# Patient Record
Sex: Female | Born: 1955 | Race: White | Hispanic: No | Marital: Single | Smoking: Never smoker
Health system: Southern US, Community
[De-identification: ages and names within clinical notes are randomized; demographics above are authoritative.]

## PROBLEM LIST (undated history)

## (undated) DIAGNOSIS — E7800 Pure hypercholesterolemia, unspecified: Secondary | ICD-10-CM

## (undated) DIAGNOSIS — F419 Anxiety disorder, unspecified: Secondary | ICD-10-CM

## (undated) HISTORY — PX: OTHER SURGICAL HISTORY: SHX169

## (undated) HISTORY — DX: Anxiety disorder, unspecified: F41.9

## (undated) HISTORY — DX: Pure hypercholesterolemia, unspecified: E78.00

## (undated) HISTORY — PX: LEG SURGERY: SHX1003

---

## 2001-02-10 ENCOUNTER — Ambulatory Visit (HOSPITAL_COMMUNITY): Admission: RE | Admit: 2001-02-10 | Discharge: 2001-02-10 | Payer: Self-pay | Admitting: Internal Medicine

## 2002-11-12 ENCOUNTER — Ambulatory Visit (HOSPITAL_BASED_OUTPATIENT_CLINIC_OR_DEPARTMENT_OTHER): Admission: RE | Admit: 2002-11-12 | Discharge: 2002-11-12 | Payer: Self-pay | Admitting: General Surgery

## 2002-11-12 ENCOUNTER — Encounter (INDEPENDENT_AMBULATORY_CARE_PROVIDER_SITE_OTHER): Payer: Self-pay | Admitting: Specialist

## 2006-01-10 ENCOUNTER — Ambulatory Visit (HOSPITAL_COMMUNITY): Admission: RE | Admit: 2006-01-10 | Discharge: 2006-01-10 | Payer: Self-pay | Admitting: Obstetrics and Gynecology

## 2006-01-10 ENCOUNTER — Encounter (INDEPENDENT_AMBULATORY_CARE_PROVIDER_SITE_OTHER): Payer: Self-pay | Admitting: Specialist

## 2007-03-23 ENCOUNTER — Emergency Department (HOSPITAL_COMMUNITY): Admission: EM | Admit: 2007-03-23 | Discharge: 2007-03-23 | Payer: Self-pay | Admitting: Emergency Medicine

## 2010-04-10 ENCOUNTER — Inpatient Hospital Stay (HOSPITAL_COMMUNITY)
Admission: EM | Admit: 2010-04-10 | Discharge: 2010-04-18 | Disposition: A | Discharge status: Other Acute Inpatient Hospital | Payer: Self-pay | Source: Home / Self Care
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
## 2010-07-18 LAB — GLUCOSE, CAPILLARY
Glucose-Capillary: 116 mg/dL — ABNORMAL HIGH (ref 70–99)
Glucose-Capillary: 125 mg/dL — ABNORMAL HIGH (ref 70–99)
Glucose-Capillary: 131 mg/dL — ABNORMAL HIGH (ref 70–99)
Glucose-Capillary: 131 mg/dL — ABNORMAL HIGH (ref 70–99)
Glucose-Capillary: 131 mg/dL — ABNORMAL HIGH (ref 70–99)
Glucose-Capillary: 138 mg/dL — ABNORMAL HIGH (ref 70–99)
Glucose-Capillary: 146 mg/dL — ABNORMAL HIGH (ref 70–99)
Glucose-Capillary: 99 mg/dL (ref 70–99)

## 2010-07-18 LAB — PREPARE FRESH FROZEN PLASMA
Unit division: 0
Unit division: 0
Unit division: 0
Unit division: 0
Unit division: 0
Unit division: 0
Unit division: 0
Unit division: 0

## 2010-07-18 LAB — CBC
HCT: 20 % — ABNORMAL LOW (ref 36.0–46.0)
HCT: 24.2 % — ABNORMAL LOW (ref 36.0–46.0)
HCT: 24.7 % — ABNORMAL LOW (ref 36.0–46.0)
HCT: 25.9 % — ABNORMAL LOW (ref 36.0–46.0)
HCT: 26 % — ABNORMAL LOW (ref 36.0–46.0)
HCT: 26.1 % — ABNORMAL LOW (ref 36.0–46.0)
Hemoglobin: 7.2 g/dL — ABNORMAL LOW (ref 12.0–15.0)
Hemoglobin: 7.6 g/dL — ABNORMAL LOW (ref 12.0–15.0)
Hemoglobin: 8.3 g/dL — ABNORMAL LOW (ref 12.0–15.0)
Hemoglobin: 8.9 g/dL — ABNORMAL LOW (ref 12.0–15.0)
Hemoglobin: 9.3 g/dL — ABNORMAL LOW (ref 12.0–15.0)
MCH: 29.7 pg (ref 26.0–34.0)
MCH: 29.9 pg (ref 26.0–34.0)
MCH: 30.4 pg (ref 26.0–34.0)
MCH: 30.5 pg (ref 26.0–34.0)
MCH: 30.5 pg (ref 26.0–34.0)
MCHC: 33.3 g/dL (ref 30.0–36.0)
MCHC: 33.8 g/dL (ref 30.0–36.0)
MCHC: 34.2 g/dL (ref 30.0–36.0)
MCHC: 34.7 g/dL (ref 30.0–36.0)
MCHC: 35.3 g/dL (ref 30.0–36.0)
MCHC: 35.9 g/dL (ref 30.0–36.0)
MCHC: 36 g/dL (ref 30.0–36.0)
MCV: 84.2 fL (ref 78.0–100.0)
MCV: 85.4 fL (ref 78.0–100.0)
MCV: 85.5 fL (ref 78.0–100.0)
MCV: 85.5 fL (ref 78.0–100.0)
MCV: 85.8 fL (ref 78.0–100.0)
MCV: 86.3 fL (ref 78.0–100.0)
MCV: 86.8 fL (ref 78.0–100.0)
MCV: 87.3 fL (ref 78.0–100.0)
Platelets: 102 10*3/uL — ABNORMAL LOW (ref 150–400)
Platelets: 499 10*3/uL — ABNORMAL HIGH (ref 150–400)
Platelets: 51 10*3/uL — ABNORMAL LOW (ref 150–400)
Platelets: 70 10*3/uL — ABNORMAL LOW (ref 150–400)
Platelets: 73 10*3/uL — ABNORMAL LOW (ref 150–400)
Platelets: 78 10*3/uL — ABNORMAL LOW (ref 150–400)
RBC: 2.33 MIL/uL — ABNORMAL LOW (ref 3.87–5.11)
RBC: 2.36 MIL/uL — ABNORMAL LOW (ref 3.87–5.11)
RBC: 2.54 MIL/uL — ABNORMAL LOW (ref 3.87–5.11)
RBC: 2.76 MIL/uL — ABNORMAL LOW (ref 3.87–5.11)
RBC: 2.79 MIL/uL — ABNORMAL LOW (ref 3.87–5.11)
RBC: 2.83 MIL/uL — ABNORMAL LOW (ref 3.87–5.11)
RDW: 14.1 % (ref 11.5–15.5)
RDW: 14.4 % (ref 11.5–15.5)
RDW: 15.1 % (ref 11.5–15.5)
RDW: 15.8 % — ABNORMAL HIGH (ref 11.5–15.5)
RDW: 17.1 % — ABNORMAL HIGH (ref 11.5–15.5)
WBC: 11.7 10*3/uL — ABNORMAL HIGH (ref 4.0–10.5)
WBC: 15.1 10*3/uL — ABNORMAL HIGH (ref 4.0–10.5)
WBC: 9.3 10*3/uL (ref 4.0–10.5)
WBC: 9.6 10*3/uL (ref 4.0–10.5)
WBC: 9.7 10*3/uL (ref 4.0–10.5)

## 2010-07-18 LAB — TYPE AND SCREEN
ABO/RH(D): A POS
Unit division: 0
Unit division: 0
Unit division: 0
Unit division: 0
Unit division: 0
Unit division: 0
Unit division: 0
Unit division: 0
Unit division: 0
Unit division: 0

## 2010-07-18 LAB — BLOOD GAS, ARTERIAL
Acid-Base Excess: 0.5 mmol/L (ref 0.0–2.0)
Acid-base deficit: 2.3 mmol/L — ABNORMAL HIGH (ref 0.0–2.0)
Acid-base deficit: 2.7 mmol/L — ABNORMAL HIGH (ref 0.0–2.0)
Bicarbonate: 21.7 mEq/L (ref 20.0–24.0)
Bicarbonate: 24.6 mEq/L — ABNORMAL HIGH (ref 20.0–24.0)
Bicarbonate: 24.7 mEq/L — ABNORMAL HIGH (ref 20.0–24.0)
Bicarbonate: 27.2 mEq/L — ABNORMAL HIGH (ref 20.0–24.0)
Drawn by: 30806
FIO2: 0.4 %
FIO2: 0.4 %
FIO2: 0.4 %
FIO2: 0.4 %
MECHVT: 550 mL
MECHVT: 550 mL
O2 Saturation: 97.9 %
O2 Saturation: 98.7 %
O2 Saturation: 98.9 %
O2 Saturation: 99.2 %
O2 Saturation: 99.2 %
O2 Saturation: 99.3 %
PEEP: 5 cmH2O
PEEP: 5 cmH2O
PEEP: 5 cmH2O
Patient temperature: 102
Patient temperature: 98.6
Patient temperature: 98.6
Patient temperature: 98.6
RATE: 12 resp/min
TCO2: 22.8 mmol/L (ref 0–100)
TCO2: 23.2 mmol/L (ref 0–100)
TCO2: 25.9 mmol/L (ref 0–100)
TCO2: 26 mmol/L (ref 0–100)
pCO2 arterial: 41.3 mmHg (ref 35.0–45.0)
pCO2 arterial: 41.8 mmHg (ref 35.0–45.0)
pH, Arterial: 7.247 — ABNORMAL LOW (ref 7.350–7.400)
pO2, Arterial: 110 mmHg — ABNORMAL HIGH (ref 80.0–100.0)
pO2, Arterial: 127 mmHg — ABNORMAL HIGH (ref 80.0–100.0)
pO2, Arterial: 140 mmHg — ABNORMAL HIGH (ref 80.0–100.0)

## 2010-07-18 LAB — ABO/RH: ABO/RH(D): A POS

## 2010-07-18 LAB — COMPREHENSIVE METABOLIC PANEL
AST: 92 U/L — ABNORMAL HIGH (ref 0–37)
Albumin: 2.2 g/dL — ABNORMAL LOW (ref 3.5–5.2)
Calcium: 6.4 mg/dL — CL (ref 8.4–10.5)
Creatinine, Ser: 0.93 mg/dL (ref 0.4–1.2)
GFR calc Af Amer: >60 mL/min (ref >60)
Total Protein: 3.9 g/dL — ABNORMAL LOW (ref 6.0–8.3)

## 2010-07-18 LAB — URINALYSIS, ROUTINE W REFLEX MICROSCOPIC
Bilirubin Urine: NEGATIVE
Ketones, ur: 15 mg/dL — AB
Nitrite: NEGATIVE
Protein, ur: NEGATIVE mg/dL
Urobilinogen, UA: 0.2 mg/dL (ref 0.0–1.0)
pH: 5 (ref 5.0–8.0)

## 2010-07-18 LAB — POCT I-STAT 3, ART BLOOD GAS (G3+)
Acid-base deficit: 6 mmol/L — ABNORMAL HIGH (ref 0.0–2.0)
Bicarbonate: 19.9 meq/L — ABNORMAL LOW (ref 20.0–24.0)
Patient temperature: 37
TCO2: 21 mmol/L (ref 0–100)
pH, Arterial: 7.315 — ABNORMAL LOW (ref 7.350–7.400)

## 2010-07-18 LAB — CULTURE, BLOOD (ROUTINE X 2): Culture: NO GROWTH

## 2010-07-18 LAB — BASIC METABOLIC PANEL
BUN: 11 mg/dL (ref 6–23)
BUN: 11 mg/dL (ref 6–23)
BUN: 13 mg/dL (ref 6–23)
BUN: 14 mg/dL (ref 6–23)
BUN: 7 mg/dL (ref 6–23)
CO2: 18 mEq/L — ABNORMAL LOW (ref 19–32)
CO2: 20 mEq/L (ref 19–32)
CO2: 20 mEq/L (ref 19–32)
CO2: 24 mEq/L (ref 19–32)
Calcium: 5.7 mg/dL — CL (ref 8.4–10.5)
Calcium: 6.4 mg/dL — CL (ref 8.4–10.5)
Calcium: 7 mg/dL — ABNORMAL LOW (ref 8.4–10.5)
Calcium: 7.1 mg/dL — ABNORMAL LOW (ref 8.4–10.5)
Chloride: 111 mEq/L (ref 96–112)
Chloride: 114 mEq/L — ABNORMAL HIGH (ref 96–112)
Chloride: 115 mEq/L — ABNORMAL HIGH (ref 96–112)
Chloride: 118 mEq/L — ABNORMAL HIGH (ref 96–112)
Creatinine, Ser: 1.01 mg/dL (ref 0.4–1.2)
Creatinine, Ser: 1.05 mg/dL (ref 0.4–1.2)
Creatinine, Ser: 1.13 mg/dL (ref 0.4–1.2)
GFR calc Af Amer: >60 mL/min (ref >60)
GFR calc non Af Amer: >60 mL/min (ref >60)
GFR calc non Af Amer: >60 mL/min (ref >60)
GFR calc non Af Amer: >60 mL/min (ref >60)
Glucose, Bld: 119 mg/dL — ABNORMAL HIGH (ref 70–99)
Glucose, Bld: 131 mg/dL — ABNORMAL HIGH (ref 70–99)
Glucose, Bld: 141 mg/dL — ABNORMAL HIGH (ref 70–99)
Glucose, Bld: 172 mg/dL — ABNORMAL HIGH (ref 70–99)
Glucose, Bld: 208 mg/dL — ABNORMAL HIGH (ref 70–99)
Potassium: 3.5 mEq/L (ref 3.5–5.1)
Potassium: 3.9 mEq/L (ref 3.5–5.1)
Potassium: 4.1 mEq/L (ref 3.5–5.1)
Sodium: 137 mEq/L (ref 135–145)
Sodium: 142 mEq/L (ref 135–145)
Sodium: 142 mEq/L (ref 135–145)

## 2010-07-18 LAB — HEMOGLOBIN A1C
Hgb A1c MFr Bld: 5.7 % — ABNORMAL HIGH (ref <5.7)
Mean Plasma Glucose: 117 mg/dL — ABNORMAL HIGH (ref <117)

## 2010-07-18 LAB — DIC (DISSEMINATED INTRAVASCULAR COAGULATION)PANEL
D-Dimer, Quant: 3.89 ug/mL-FEU — ABNORMAL HIGH (ref 0.00–0.48)
Fibrinogen: 161 mg/dL — ABNORMAL LOW (ref 204–475)
Fibrinogen: 461 mg/dL (ref 204–475)
INR: 1.73 — ABNORMAL HIGH (ref 0.00–1.49)
Platelets: 41 10*3/uL — ABNORMAL LOW (ref 150–400)
Platelets: 74 10*3/uL — ABNORMAL LOW (ref 150–400)
Smear Review: NONE SEEN
aPTT: 33 seconds (ref 24–37)
aPTT: 39 seconds — ABNORMAL HIGH (ref 24–37)

## 2010-07-18 LAB — LACTIC ACID, PLASMA: Lactic Acid, Venous: 6 mmol/L — ABNORMAL HIGH (ref 0.5–2.2)

## 2010-07-18 LAB — CATH TIP CULTURE

## 2010-07-18 LAB — POCT I-STAT, CHEM 8
Creatinine, Ser: 1 mg/dL (ref 0.4–1.2)
Hemoglobin: 13.3 g/dL (ref 12.0–15.0)
Potassium: 3.6 meq/L (ref 3.5–5.1)
Sodium: 139 meq/L (ref 135–145)

## 2010-07-18 LAB — URINE CULTURE: Culture  Setup Time: 201112052037

## 2010-07-18 LAB — PREPARE CRYOPRECIPITATE: Unit division: 0

## 2010-07-18 LAB — PLATELET COUNT: Platelets: 76 10*3/uL — ABNORMAL LOW (ref 150–400)

## 2010-07-18 LAB — HEMOGLOBIN AND HEMATOCRIT, BLOOD
HCT: 27.5 % — ABNORMAL LOW (ref 36.0–46.0)
Hemoglobin: 9.4 g/dL — ABNORMAL LOW (ref 12.0–15.0)
Hemoglobin: 9.6 g/dL — ABNORMAL LOW (ref 12.0–15.0)

## 2010-07-18 LAB — MRSA PCR SCREENING
MRSA by PCR: NEGATIVE
MRSA by PCR: NEGATIVE

## 2010-07-18 LAB — PROTIME-INR
INR: 0.8 (ref 0.00–1.49)
INR: 1.21 (ref 0.00–1.49)
INR: 1.24 (ref 0.00–1.49)
INR: 1.27 (ref 0.00–1.49)
INR: 1.33 (ref 0.00–1.49)
INR: 1.34 (ref 0.00–1.49)
INR: 1.51 — ABNORMAL HIGH (ref 0.00–1.49)
Prothrombin Time: 15.5 seconds — ABNORMAL HIGH (ref 11.6–15.2)
Prothrombin Time: 16.7 seconds — ABNORMAL HIGH (ref 11.6–15.2)
Prothrombin Time: 18.4 seconds — ABNORMAL HIGH (ref 11.6–15.2)
Prothrombin Time: 19.2 seconds — ABNORMAL HIGH (ref 11.6–15.2)

## 2010-07-18 LAB — POCT I-STAT 4, (NA,K, GLUC, HGB,HCT)
Glucose, Bld: 167 mg/dL — ABNORMAL HIGH (ref 70–99)
HCT: 17 % — ABNORMAL LOW (ref 36.0–46.0)
Hemoglobin: 5.8 g/dL — CL (ref 12.0–15.0)
Potassium: 4 meq/L (ref 3.5–5.1)
Sodium: 143 meq/L (ref 135–145)

## 2010-07-18 LAB — CALCIUM, IONIZED
Calcium, Ion: 1.04 mmol/L — ABNORMAL LOW (ref 1.12–1.32)
Calcium, Ion: 1.09 mmol/L — ABNORMAL LOW (ref 1.12–1.32)

## 2010-07-18 LAB — DIFFERENTIAL
Basophils Relative: 1 % (ref 0–1)
Eosinophils Absolute: 0.3 10*3/uL (ref 0.0–0.7)
Monocytes Absolute: 1.7 10*3/uL — ABNORMAL HIGH (ref 0.1–1.0)
Neutrophils Relative %: 70 % (ref 43–77)

## 2010-07-18 LAB — CULTURE, RESPIRATORY W GRAM STAIN

## 2010-07-18 LAB — PREPARE RBC (CROSSMATCH)

## 2010-07-18 LAB — PREPARE PLATELETS

## 2010-09-22 NOTE — Op Note (Signed)
   NAMEALFRED, Holmes                         ACCOUNT NO.:  [id number]   MEDICAL RECORD NO.:  [id number]                   PATIENT TYPE:  AMB   LOCATION:  DSC                                  FACILITY:  [facility]   PHYSICIAN:  Adolph Pollack, M.D.            DATE OF BIRTH:  09-04-1955   DATE OF PROCEDURE:  11/12/2002  DATE OF DISCHARGE:                                 OPERATIVE REPORT   PREOPERATIVE DIAGNOSIS:  Left nipple mass.   POSTOPERATIVE DIAGNOSIS:  Left nipple mass.   PROCEDURE:  Excision of left nipple mass.   SURGEON:  Adolph Pollack, M.D.   ANESTHESIA:  Local (1% lidocaine with epinephrine plus 0.5% plain Marcaine).   INDICATIONS:  Claudia Holmes is a 55 year old female who has had a left nipple  mass for some time.  She has noted it has changed.  She thinks it could have  been from some irritation from a sports bra.  On examination, indeed she has  a left middle mass and now presents for excision.  The procedure and risks  were explained to her preoperatively.   TECHNIQUE:  She was seen in the minor procedure room, and my initials were  placed on the left breast.  The left nipple area was then sterilely prepped  and draped.  Local anesthetic was infiltrated just superficially around the  nipple and around the mass.  The mass was then excised sharply.  Bleeding  was controlled with silver nitrate sticks.  Once hemostasis was adequate, I  placed this in antibiotic ointment and a sterile dressing on it.   She tolerated the procedure well without any apparent complications.  She  will be discharged to home in satisfactory condition.  She was given a  prescription for Vicodin if she needs it for pain.  I told her to call in  one week to see me in the office.  I told her to keep the bandage clean and  dry for two days, then she may remove it and shower and apply antibiotic  ointment and a bandage daily following that.     Adolph Pollack, M.D.    Kari Baars  D:  11/12/2002  T:  11/13/2002  Job:  [id number]   cc:   Barry Dienes. Eloise Harman, M.D.  [address]  [place]  [place] [address]  Fax: [phone]

## 2010-09-22 NOTE — H&P (Signed)
Claudia Holmes, Claudia Holmes               ACCOUNT NO.:  [id number]   MEDICAL RECORD NO.:  [id number]          PATIENT TYPE:  AMB   LOCATION:  [facility]                           [facility]   PHYSICIAN:  Zenaida Niece, M.D.DATE OF BIRTH:  1956-01-04   DATE OF ADMISSION:  01/10/2006  DATE OF DISCHARGE:                                HISTORY & PHYSICAL   CHIEF COMPLAINT:  Abnormal uterine bleeding and probable endometrial polyp.   HISTORY OF PRESENT ILLNESS:  This is a 55 year old white female, gravida 0,  para 0, whom I saw in July of this year for the first time.  She had been on  birth control pills for 2-3 months with regular periods but also frequent  breakthrough bleeding.  She was changed to [employer] 1/35 by her primary  physician for approximately 2 months, and this did not help. She then  stopped the birth control pills for approximately a month and continued to  have spotting.  Prior to starting the birth control pills, she had regular  menses without problems.  On initial pelvic exam, she had a small  endocervical polyp which was removed, and this returned benign.  She then  followed up on August 8 for a saline infusion ultrasound.  This revealed a  normal uterus with a probable 9 mm endometrial polyp in the anterior fundus.  She had normal ovaries.  Endometrial biopsy was performed at that time and  revealed benign late proliferative, early secretory endometrium without  evidence of hyperplasia or carcinoma.  Due to her abnormal bleeding and a  possible endometrial polyp, she is being admitted for hysteroscopy with  dilation and curettage and possible resection and endometrial ablation.   PAST MEDICAL HISTORY:  Significant for:  1. Hypothyroidism.  2. Hypertension.  3. Hypercholesterolemia.   PAST SURGICAL HISTORY:  Negative.   ALLERGIES:  None.   CURRENT MEDICATIONS:  Synthroid, hydrochlorothiazide, Vytorin,  spironolactone, and baby aspirin.   GYN HISTORY:  No history of  abnormal Pap smears or sexually transmitted  diseases.   SOCIAL HISTORY:  She is single and denies alcohol, tobacco, or drug use.   FAMILY HISTORY:  No GYN or colon cancer.   REVIEW OF SYSTEMS:  Normal bowel and bladder function and no other  complaints.   PHYSICAL EXAMINATION:  VITAL SIGNS: Weight is approximately 200 pounds.  GENERAL: This is a slightly obese white female in no acute distress.  NECK: Supple without lymphadenopathy or thyromegaly.  LUNGS: Clear to auscultation.  HEART: Regular rate and rhythm without murmur.  ABDOMEN: Soft, nontender, nondistended without palpable masses.  EXTREMITIES:  No edema and are nontender.  PELVIC: External genitalia has no lesions.  On speculum exam, she has a  normal cervix, and on bimanual exam, she has a small anteverted nontender  uterus and no adnexal masses.   ASSESSMENT:  Abnormal uterine bleeding with a possible endometrial polyp.  She has a benign endometrial biopsy and possible polyp by saline infusion  ultrasound.  All options have been discussed with the patient.  She wishes  to proceed with hysteroscopy with dilatation and  curettage and removal of  this polyp followed by endometrial ablation.  All risks of surgery have been  discussed, and she understands.   PLAN:  Admit the patient on the day of surgery for hysteroscopy with  dilatation and curettage, possible resection and NovaSure endometrial  ablation.      Zenaida Niece, M.D.  Electronically Signed     TDM/MEDQ  D:  01/09/2006  T:  01/09/2006  Job:  [id number]

## 2010-09-22 NOTE — Op Note (Signed)
NAMESHAKINAH, Claudia Holmes               ACCOUNT NO.:  [id number]   MEDICAL RECORD NO.:  [id number]          PATIENT TYPE:  AMB   LOCATION:  [facility]C                           FACILITY:  [facility]   PHYSICIAN:  Zenaida Niece, M.D.DATE OF BIRTH:  [birth year]   DATE OF PROCEDURE:  01/10/2006  DATE OF DISCHARGE:                                 OPERATIVE REPORT   PREOPERATIVE AND POSTOPERATIVE DIAGNOSES:  Abnormal uterine bleeding with  possible endometrial polyp.   PROCEDURES:  Hysteroscopy with dilation and curettage and endometrial  ablation with Novasure.   SURGEON:  Zenaida Niece, M.D.   ASSISTANTS:  None.   ANESTHESIA:  General with an LMA.   SPECIMENS:  Endometrial curettings to pathology.   ESTIMATED BLOOD LOSS:  Minimal.   COMPLICATIONS:  None.   FINDINGS:  She had an essentially normal endometrial cavity with a small  polyp at approximately 4 o'clock.  This was removed with curette.  The  Novasure device used a uterine depth of 4 cm, a uterine width of 2.5 cm and  used 55 watts for 83 seconds.   PROCEDURE IN DETAIL:  The patient was taken to the operating room and placed  in the dorsosupine position.  General anesthesia was induced with an LMA and  she was placed in mobile stirrups.  Perineum and vagina were then prepped  and draped in the usual sterile fashion and bladder drained with a red  rubber catheter.  A Graves speculum was inserted into the vagina and the  anterior lip of the cervix was grasped with a single-tooth tenaculum.  Paracervical block was then performed with 16 mL of 2% plain lidocaine.  Uterus sounded to 8 cm.  Cervix was gradually dilated to a size 17 dilator  and the observer hysteroscope was inserted and excellent visualization was  achieved.  She had a fairly atrophic endometrial cavity except for a small  polyp arising from approximately 4 o'clock.  Tubal ostia were normal.  The  hysteroscope was removed and sharp curettage was performed, paying  special  attention to 4 o'clock.  A small amount of tissue was removed.  The  hysteroscope was reinserted and most of the polyp had been removed.  There  was a small amount left.  Sharp curettage was again performed and a small  amount of tissue was removed.  The cervix then measured to 4 cm with the  size 7 Hegar dilator and the cervix was dilated to a size 7 and then the  size 8 Hegar dilator.  The Novasure device was then easily inserted and  appropriately deployed.  Measurements were put into the machine and the CO2  test was passed.  Endometrial ablation was then performed as above without  complications.  The Novasure device was removed and found to be intact.  Hysteroscopy was again performed and revealed good ablation of the entire  endometrial cavity.  A small remnant of the polyp was visible and I  attempted to remove this with curettage and polyp forceps.  A small amount  of tissue  was retrieved.  The single-tooth tenaculum  was then removed and bleeding  controlled with pressure.  All instruments were then removed from the  vagina.  The patient tolerated the procedure well and was taken to the  recovery room in stable condition.  Counts were correct x2.      Zenaida Niece, M.D.  Electronically Signed     TDM/MEDQ  D:  01/10/2006  T:  01/10/2006  Job:  [id number]

## 2013-12-22 ENCOUNTER — Ambulatory Visit (INDEPENDENT_AMBULATORY_CARE_PROVIDER_SITE_OTHER): Payer: Federal, State, Local not specified - PPO | Admitting: Podiatry

## 2013-12-22 ENCOUNTER — Other Ambulatory Visit: Payer: Self-pay | Admitting: *Deleted

## 2013-12-22 ENCOUNTER — Encounter: Payer: Self-pay | Admitting: Podiatry

## 2013-12-22 ENCOUNTER — Ambulatory Visit (INDEPENDENT_AMBULATORY_CARE_PROVIDER_SITE_OTHER): Payer: Federal, State, Local not specified - PPO

## 2013-12-22 VITALS — BP 131/92 | HR 74 | Resp 16 | Ht 64.0 in | Wt 216.0 lb

## 2013-12-22 DIAGNOSIS — M21619 Bunion of unspecified foot: Secondary | ICD-10-CM

## 2013-12-22 DIAGNOSIS — M2010 Hallux valgus (acquired), unspecified foot: Secondary | ICD-10-CM
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
## 2013-12-23 ENCOUNTER — Telehealth: Payer: Self-pay | Admitting: *Deleted

## 2013-12-23 NOTE — Telephone Encounter (Signed)
I called and informed her that Dr. Milinda Pointer is out of the office. He will not return until next week.  I'll call you back with a response then.  She stated okay thanks for calling and letting me know.
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
## 2013-12-28 NOTE — Telephone Encounter (Signed)
The doctors who did her surgery and prescribed the  Compression hose should do that.  I don't feel comfortable doing it. I have only seen her once and it wasn't for the edema.

## 2013-12-29 NOTE — Telephone Encounter (Signed)
I called and informed her that Dr. Milinda Pointer said you will need to get the doctor that did your surgery and wrote the prescription for the compression hose to write the note.  She stated oh okay.

## 2014-04-25 ENCOUNTER — Emergency Department (HOSPITAL_COMMUNITY)
Admission: EM | Admit: 2014-04-25 | Discharge: 2014-04-26 | Disposition: A | Discharge status: Home / Self Care | Payer: Federal, State, Local not specified - PPO | Attending: Emergency Medicine | Admitting: Emergency Medicine

## 2014-04-25 ENCOUNTER — Encounter (HOSPITAL_COMMUNITY): Payer: Self-pay

## 2014-04-25 ENCOUNTER — Emergency Department (HOSPITAL_COMMUNITY): Payer: Federal, State, Local not specified - PPO

## 2014-04-25 DIAGNOSIS — Z79899 Other long term (current) drug therapy: Secondary | ICD-10-CM | POA: Diagnosis not present

## 2014-04-25 DIAGNOSIS — F419 Anxiety disorder, unspecified: Secondary | ICD-10-CM | POA: Insufficient documentation

## 2014-04-25 DIAGNOSIS — E780 Pure hypercholesterolemia: Secondary | ICD-10-CM | POA: Insufficient documentation

## 2014-04-25 DIAGNOSIS — R079 Chest pain, unspecified: Secondary | ICD-10-CM

## 2014-04-25 LAB — BASIC METABOLIC PANEL
Anion gap: 15 (ref 5–15)
BUN: 20 mg/dL (ref 6–23)
CALCIUM: 9.9 mg/dL (ref 8.4–10.5)
CO2: 24 mEq/L (ref 19–32)
CREATININE: 1.03 mg/dL (ref 0.50–1.10)
Chloride: 103 mEq/L (ref 96–112)
GFR calc Af Amer: 69 mL/min — ABNORMAL LOW (ref >90)
GFR calc non Af Amer: 59 mL/min — ABNORMAL LOW (ref >90)
GLUCOSE: 104 mg/dL — AB (ref 70–99)
Potassium: 4.2 mEq/L (ref 3.7–5.3)
Sodium: 142 mEq/L (ref 137–147)

## 2014-04-25 LAB — CBC
HCT: 40.6 % (ref 36.0–46.0)
HEMOGLOBIN: 13.3 g/dL (ref 12.0–15.0)
MCH: 28.1 pg (ref 26.0–34.0)
MCHC: 32.8 g/dL (ref 30.0–36.0)
MCV: 85.7 fL (ref 78.0–100.0)
Platelets: 281 10*3/uL (ref 150–400)
RBC: 4.74 MIL/uL (ref 3.87–5.11)
RDW: 13.4 % (ref 11.5–15.5)
WBC: 8.6 10*3/uL (ref 4.0–10.5)

## 2014-04-25 LAB — I-STAT TROPONIN, ED: Troponin i, poc: 0 ng/mL (ref 0.00–0.08)

## 2014-04-25 MED ORDER — NITROGLYCERIN 2 % TD OINT
0.5000 [in_us] | TOPICAL_OINTMENT | Freq: Once | TRANSDERMAL | Status: AC
  Administered 2014-04-25: 0.5 [in_us] via TOPICAL
  Filled 2014-04-25: qty 1

## 2014-04-25 NOTE — ED Notes (Signed)
PER EMS: pt from home and pt reports sharp, pressure central CP that started around 1900. Denies SOB/N/V. BP-132/90, HR-74, EKG-sinus rhythm.

## 2014-04-26 LAB — I-STAT TROPONIN, ED: Troponin i, poc: 0 ng/mL (ref 0.00–0.08)

## 2014-04-26 MED ORDER — ASPIRIN 81 MG PO CHEW: 81.0000 mg | CHEWABLE_TABLET | Freq: Every day | ORAL | Status: DC
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
## 2014-04-26 NOTE — Discharge Instructions (Signed)

## 2014-04-26 NOTE — ED Notes (Signed)
Pt A&OX4, ambulatory at d/c with steady gait, NAD 

## 2014-04-29 ENCOUNTER — Other Ambulatory Visit: Payer: Self-pay | Admitting: Cardiology

## 2014-04-29 DIAGNOSIS — R079 Chest pain, unspecified: Secondary | ICD-10-CM

## 2014-05-06 ENCOUNTER — Ambulatory Visit
Admission: RE | Admit: 2014-05-06 | Discharge: 2014-05-06 | Disposition: A | Discharge status: Home / Self Care | Payer: Federal, State, Local not specified - PPO | Source: Ambulatory Visit | Attending: Cardiology | Admitting: Cardiology

## 2014-05-06 DIAGNOSIS — R079 Chest pain, unspecified: Secondary | ICD-10-CM

## 2015-03-05 ENCOUNTER — Emergency Department (HOSPITAL_COMMUNITY)
Admission: EM | Admit: 2015-03-05 | Discharge: 2015-03-05 | Disposition: A | Discharge status: Home / Self Care | Payer: Federal, State, Local not specified - PPO | Source: Home / Self Care

## 2015-03-05 ENCOUNTER — Encounter (HOSPITAL_COMMUNITY): Payer: Self-pay | Admitting: Emergency Medicine

## 2015-03-05 DIAGNOSIS — L509 Urticaria, unspecified: Secondary | ICD-10-CM | POA: Diagnosis not present

## 2015-03-05 MED ORDER — PREDNISONE 20 MG PO TABS: ORAL_TABLET | ORAL | Status: DC

## 2015-03-05 MED ORDER — TRIAMCINOLONE ACETONIDE 0.1 % EX CREA: 1.0000 "application " | TOPICAL_CREAM | Freq: Two times a day (BID) | CUTANEOUS | Status: DC

## 2015-03-05 MED ORDER — TRIAMCINOLONE ACETONIDE 40 MG/ML IJ SUSP
40.0000 mg | Freq: Once | INTRAMUSCULAR | Status: AC
  Administered 2015-03-05: 40 mg via INTRAMUSCULAR

## 2015-03-05 MED ORDER — TRIAMCINOLONE ACETONIDE 40 MG/ML IJ SUSP
INTRAMUSCULAR | Status: AC
  Filled 2015-03-05: qty 1

## 2015-03-05 NOTE — Discharge Instructions (Signed)
Hives Cold compresses locally Triamcinolone twice daily Prednisone taper dose Benadryl as directed or for non drowsy Claritin, Allegra or Zyrtec. Hives are itchy, red, swollen areas of the skin. They can vary in size and location on your body. Hives can come and go for hours or several days (acute hives) or for several weeks (chronic hives). Hives do not spread from person to person (noncontagious). They may get worse with scratching, exercise, and emotional stress. CAUSES   Allergic reaction to food, additives, or drugs.  Infections, including the common cold.  Illness, such as vasculitis, lupus, or thyroid disease.  Exposure to sunlight, heat, or cold.  Exercise.  Stress.  Contact with chemicals. SYMPTOMS   Red or white swollen patches on the skin. The patches may change size, shape, and location quickly and repeatedly.  Itching.  Swelling of the hands, feet, and face. This may occur if hives develop deeper in the skin. DIAGNOSIS  Your caregiver can usually tell what is wrong by performing a physical exam. Skin or blood tests may also be done to determine the cause of your hives. In some cases, the cause cannot be determined. TREATMENT  Mild cases usually get better with medicines such as antihistamines. Severe cases may require an emergency epinephrine injection. If the cause of your hives is known, treatment includes avoiding that trigger.  HOME CARE INSTRUCTIONS   Avoid causes that trigger your hives.  Take antihistamines as directed by your caregiver to reduce the severity of your hives. Non-sedating or low-sedating antihistamines are usually recommended. Do not drive while taking an antihistamine.  Take any other medicines prescribed for itching as directed by your caregiver.  Wear loose-fitting clothing.  Keep all follow-up appointments as directed by your caregiver. SEEK MEDICAL CARE IF:   You have persistent or severe itching that is not relieved with  medicine.  You have painful or swollen joints. SEEK IMMEDIATE MEDICAL CARE IF:   You have a fever.  Your tongue or lips are swollen.  You have trouble breathing or swallowing.  You feel tightness in the throat or chest.  You have abdominal pain. These problems may be the first sign of a life-threatening allergic reaction. Call your local emergency services (911 in [place] MAKE SURE YOU:   Understand these instructions.  Will watch your condition.  Will get help right away if you are not doing well or get worse.   This information is not intended to replace advice given to you by your health care provider. Make sure you discuss any questions you have with your health care provider.   Document Released: 04/23/2005 Document Revised: 04/28/2013 Document Reviewed: 07/17/2011 Elsevier Interactive Patient Education [employer].

## 2015-03-05 NOTE — ED Provider Notes (Addendum)
CSN: [id number]     Arrival date & time 03/05/15  1913 History   None    Chief Complaint  Patient presents with  . Allergic Reaction  . Facial Swelling   (Consider location/radiation/quality/duration/timing/severity/associated sxs/prior Treatment) HPI Comments: 59 year old female states that around noontime today while at a ball game she started developing a rash on the left arm in the antecubital fossa. This was an itchy red rash that has continued to increase in size. Identical rash occurred to the right antecubital fossa little bit later on. Following this she noticed an itchy red rash circumscribing the waist, under the waistband of her close. It is the same type of rash is on the arms. She has a small lesion to the left side of her face near the corner of her mouth. There is localized swelling and itching. She denies problems breathing, no cough no intraoral swelling or choking or problems swallowing.   Past Medical History  Diagnosis Date  . Anxiety   . High cholesterol    Past Surgical History  Procedure Laterality Date  . Leg surgery    . Arm surgery     No family history on file. Social History  Substance Use Topics  . Smoking status: Never Smoker   . Smokeless tobacco: Never Used  . Alcohol Use: None   OB History    No data available     Review of Systems  HENT: Positive for facial swelling and postnasal drip. Negative for sore throat, trouble swallowing and voice change.   Eyes: Negative.   Respiratory: Negative for cough, choking, chest tightness, shortness of breath and wheezing.   Cardiovascular: Negative.   Musculoskeletal: Negative.   Skin: Positive for rash.  Neurological: Negative.   All other systems reviewed and are negative.   Allergies  Review of patient's allergies indicates no known allergies.  Home Medications   Prior to Admission medications   Medication Sig Start Date End Date Taking? Authorizing Provider  ALPRAZolam Duanne Moron) 1 MG tablet  Take 1 mg by mouth at bedtime as needed for anxiety.    Historical Provider, MD  aspirin 81 MG chewable tablet Chew 1 tablet (81 mg total) by mouth daily. 04/26/14   Ernestina Patches, MD  atorvastatin (LIPITOR) 20 MG tablet Take 20 mg by mouth daily.  12/11/13   Historical Provider, MD  ibuprofen (ADVIL,MOTRIN) 800 MG tablet Take 800 mg by mouth every 8 (eight) hours as needed for moderate pain.    Historical Provider, MD  Mirabegron (MYRBETRIQ PO) Take 1 tablet by mouth daily.    Historical Provider, MD  predniSONE (DELTASONE) 20 MG tablet Take 3 tabs po on first day, 2 tabs second day, 2 tabs third day, 1 tab fourth day, 1 tab 5th day. Take with food. 03/05/15   Janne Napoleon, NP  TOVIAZ 8 MG TB24 tablet Take 8 mg by mouth daily.  11/06/13   Historical Provider, MD  triamcinolone cream (KENALOG) 0.1 % Apply 1 application topically 2 (two) times daily. 03/05/15   Janne Napoleon, NP   Meds Ordered and Administered this Visit   Medications  triamcinolone acetonide (KENALOG-40) injection 40 mg (not administered)    BP 149/82 mmHg  Pulse 74  Temp(Src) 98.5 F (36.9 C) (Oral)  SpO2 99% No data found.   Physical Exam  Constitutional: She appears well-developed and well-nourished. No distress.  HENT:  Mouth/Throat: Oropharynx is clear and moist. No oropharyngeal exudate.  No intraoral swelling, erythema or other discoloration. Oropharynx with PND and  mild cobblestoning. Airway is widely patent.  Eyes: Conjunctivae and EOM are normal.  Neck: Normal range of motion. Neck supple.  No swelling or rash around the anterior or posterior neck.  Cardiovascular: Normal rate, regular rhythm and normal heart sounds.   Pulmonary/Chest: Effort normal and breath sounds normal.  Musculoskeletal: She exhibits no edema.  Lymphadenopathy:    She has no cervical adenopathy.  Neurological: She is alert. No cranial nerve deficit. She exhibits normal muscle tone.  Skin: Skin is warm and dry.  There is a well marginated  raised red area in the left antecubital fossa measuring approximately 15 cm in diameter. A smaller but similar lesion occurs on the right antecubital fossa. There is a 4-5 cm wide band of urticaria that is circumferential at the waistline.  Small area of swelling to the left face and corner of the mouth. These lesions appear typical of bee stings or insect bites however patient denies any known bites or stings.   Psychiatric: She has a normal mood and affect.  Nursing note and vitals reviewed.   ED Course  Procedures (including critical care time)  Labs Review Labs Reviewed - No data to display  Imaging Review No results found.   Visual Acuity Review  Right Eye Distance:   Left Eye Distance:   Bilateral Distance:    Right Eye Near:   Left Eye Near:    Bilateral Near:         MDM   1. Urticaria of unknown origin   kenalog 40 mg IM Cold compresses locally Triamcinolone twice daily Prednisone taper dose Benadryl as directed or for non drowsy Claritin, Allegra or Zyrtec. For swelling of the tongue or inside the mouth, wheezing, cough or trouble breathing or other sick symptoms go to the emergency department promptly or call EMS.    Janne Napoleon, NP 03/05/15 2111  Janne Napoleon, NP 04/27/15 2119

## 2015-03-05 NOTE — ED Notes (Signed)
Pt here with rash progressively spreading all over Started in left arm with swelling and cellulitis spreading to wait area, right upper arm and now facial swelling   numbness noted, denies pain Itching all over,denies eating new foods or meds

## 2015-09-16 DIAGNOSIS — E038 Other specified hypothyroidism: Secondary | ICD-10-CM | POA: Diagnosis not present

## 2015-09-16 DIAGNOSIS — E784 Other hyperlipidemia: Secondary | ICD-10-CM | POA: Diagnosis not present

## 2015-09-16 DIAGNOSIS — Z0000 Encounter for general adult medical examination without abnormal findings: Secondary | ICD-10-CM | POA: Diagnosis not present

## 2015-09-28 DIAGNOSIS — I872 Venous insufficiency (chronic) (peripheral): Secondary | ICD-10-CM | POA: Diagnosis not present

## 2015-09-28 DIAGNOSIS — Z1389 Encounter for screening for other disorder: Secondary | ICD-10-CM | POA: Diagnosis not present

## 2015-09-28 DIAGNOSIS — M79661 Pain in right lower leg: Secondary | ICD-10-CM | POA: Diagnosis not present

## 2015-09-28 DIAGNOSIS — Z0000 Encounter for general adult medical examination without abnormal findings: Secondary | ICD-10-CM | POA: Diagnosis not present

## 2015-09-28 DIAGNOSIS — L989 Disorder of the skin and subcutaneous tissue, unspecified: Secondary | ICD-10-CM | POA: Diagnosis not present

## 2015-09-28 DIAGNOSIS — N3281 Overactive bladder: Secondary | ICD-10-CM | POA: Diagnosis not present

## 2015-09-28 DIAGNOSIS — I82431 Acute embolism and thrombosis of right popliteal vein: Secondary | ICD-10-CM | POA: Diagnosis not present

## 2015-09-28 DIAGNOSIS — M7121 Synovial cyst of popliteal space [Baker], right knee: Secondary | ICD-10-CM | POA: Diagnosis not present

## 2015-10-21 DIAGNOSIS — Z1231 Encounter for screening mammogram for malignant neoplasm of breast: Secondary | ICD-10-CM | POA: Diagnosis not present

## 2015-11-17 DIAGNOSIS — L905 Scar conditions and fibrosis of skin: Secondary | ICD-10-CM | POA: Diagnosis not present

## 2015-11-17 DIAGNOSIS — Z130 Encounter for screening for diseases of the blood and blood-forming organs and certain disorders involving the immune mechanism: Secondary | ICD-10-CM | POA: Diagnosis not present

## 2015-11-17 DIAGNOSIS — Z1389 Encounter for screening for other disorder: Secondary | ICD-10-CM | POA: Diagnosis not present

## 2015-11-17 DIAGNOSIS — L308 Other specified dermatitis: Secondary | ICD-10-CM | POA: Diagnosis not present

## 2015-11-17 DIAGNOSIS — R8299 Other abnormal findings in urine: Secondary | ICD-10-CM | POA: Diagnosis not present

## 2015-11-17 DIAGNOSIS — Z6836 Body mass index (BMI) 36.0-36.9, adult: Secondary | ICD-10-CM | POA: Diagnosis not present

## 2015-11-17 DIAGNOSIS — Z01419 Encounter for gynecological examination (general) (routine) without abnormal findings: Secondary | ICD-10-CM | POA: Diagnosis not present

## 2015-11-22 DIAGNOSIS — I82401 Acute embolism and thrombosis of unspecified deep veins of right lower extremity: Secondary | ICD-10-CM | POA: Diagnosis not present

## 2015-11-22 DIAGNOSIS — Z6836 Body mass index (BMI) 36.0-36.9, adult: Secondary | ICD-10-CM | POA: Diagnosis not present

## 2015-11-22 DIAGNOSIS — M79661 Pain in right lower leg: Secondary | ICD-10-CM | POA: Diagnosis not present

## 2016-02-23 ENCOUNTER — Ambulatory Visit (INDEPENDENT_AMBULATORY_CARE_PROVIDER_SITE_OTHER): Admitting: Orthopaedic Surgery

## 2016-02-23 DIAGNOSIS — M25561 Pain in right knee: Secondary | ICD-10-CM | POA: Diagnosis not present

## 2016-03-23 DIAGNOSIS — Z6837 Body mass index (BMI) 37.0-37.9, adult: Secondary | ICD-10-CM | POA: Diagnosis not present

## 2016-03-23 DIAGNOSIS — M79661 Pain in right lower leg: Secondary | ICD-10-CM | POA: Diagnosis not present

## 2016-03-23 DIAGNOSIS — I10 Essential (primary) hypertension: Secondary | ICD-10-CM | POA: Diagnosis not present

## 2016-03-23 DIAGNOSIS — I82401 Acute embolism and thrombosis of unspecified deep veins of right lower extremity: Secondary | ICD-10-CM | POA: Diagnosis not present

## 2016-05-28 DIAGNOSIS — R350 Frequency of micturition: Secondary | ICD-10-CM | POA: Diagnosis not present

## 2016-05-28 DIAGNOSIS — N3946 Mixed incontinence: Secondary | ICD-10-CM | POA: Diagnosis not present

## 2016-09-28 DIAGNOSIS — I10 Essential (primary) hypertension: Secondary | ICD-10-CM | POA: Diagnosis not present

## 2016-09-28 DIAGNOSIS — E038 Other specified hypothyroidism: Secondary | ICD-10-CM | POA: Diagnosis not present

## 2016-09-28 DIAGNOSIS — Z0000 Encounter for general adult medical examination without abnormal findings: Secondary | ICD-10-CM | POA: Diagnosis not present

## 2016-10-05 DIAGNOSIS — Z1389 Encounter for screening for other disorder: Secondary | ICD-10-CM | POA: Diagnosis not present

## 2016-10-05 DIAGNOSIS — E784 Other hyperlipidemia: Secondary | ICD-10-CM | POA: Diagnosis not present

## 2016-10-05 DIAGNOSIS — Z0000 Encounter for general adult medical examination without abnormal findings: Secondary | ICD-10-CM | POA: Diagnosis not present

## 2016-10-05 DIAGNOSIS — N3281 Overactive bladder: Secondary | ICD-10-CM | POA: Diagnosis not present

## 2016-10-05 DIAGNOSIS — E668 Other obesity: Secondary | ICD-10-CM | POA: Diagnosis not present

## 2016-10-05 DIAGNOSIS — G4709 Other insomnia: Secondary | ICD-10-CM | POA: Diagnosis not present

## 2016-10-22 DIAGNOSIS — Z1231 Encounter for screening mammogram for malignant neoplasm of breast: Secondary | ICD-10-CM | POA: Diagnosis not present

## 2016-11-19 DIAGNOSIS — Z6838 Body mass index (BMI) 38.0-38.9, adult: Secondary | ICD-10-CM | POA: Diagnosis not present

## 2016-11-19 DIAGNOSIS — Z130 Encounter for screening for diseases of the blood and blood-forming organs and certain disorders involving the immune mechanism: Secondary | ICD-10-CM | POA: Diagnosis not present

## 2016-11-19 DIAGNOSIS — Z01419 Encounter for gynecological examination (general) (routine) without abnormal findings: Secondary | ICD-10-CM | POA: Diagnosis not present

## 2016-11-19 DIAGNOSIS — N3941 Urge incontinence: Secondary | ICD-10-CM | POA: Diagnosis not present

## 2016-11-19 DIAGNOSIS — Z1389 Encounter for screening for other disorder: Secondary | ICD-10-CM | POA: Diagnosis not present

## 2016-11-20 ENCOUNTER — Encounter (INDEPENDENT_AMBULATORY_CARE_PROVIDER_SITE_OTHER): Payer: Self-pay | Admitting: Orthopaedic Surgery

## 2016-11-20 ENCOUNTER — Ambulatory Visit (INDEPENDENT_AMBULATORY_CARE_PROVIDER_SITE_OTHER): Admitting: Orthopaedic Surgery

## 2016-11-20 ENCOUNTER — Ambulatory Visit (INDEPENDENT_AMBULATORY_CARE_PROVIDER_SITE_OTHER): Payer: Self-pay

## 2016-11-20 VITALS — BP 137/87 | HR 73 | Ht 64.0 in | Wt 223.0 lb

## 2016-11-20 DIAGNOSIS — M94261 Chondromalacia, right knee: Secondary | ICD-10-CM | POA: Diagnosis not present

## 2016-11-20 DIAGNOSIS — M25561 Pain in right knee: Secondary | ICD-10-CM

## 2016-11-21 LAB — SYNOVIAL CELL COUNT + DIFF, W/ CRYSTALS
Basophils, %: 0 %
EOSINOPHILS-SYNOVIAL: 0 % (ref 0–2)
LYMPHOCYTES-SYNOVIAL FLD: 37 % (ref 0–74)
MONOCYTE/MACROPHAGE: 32 % (ref 0–69)
Neutrophil, Synovial: 21 % (ref 0–24)
Synoviocytes, %: 10 % (ref 0–15)
WBC, Synovial: 230 cells/uL — ABNORMAL HIGH (ref <150)

## 2016-11-21 MED ORDER — BUPIVACAINE HCL 0.25 % IJ SOLN
6.0000 mL | INTRAMUSCULAR | Status: AC | PRN
  Administered 2016-11-21: 6 mL via INTRA_ARTICULAR

## 2016-11-21 MED ORDER — LIDOCAINE HCL 1 % IJ SOLN
3.0000 mL | INTRAMUSCULAR | Status: AC | PRN
Start: 2016-11-21 — End: 2016-11-21
  Administered 2016-11-21: 3 mL

## 2016-11-21 MED ORDER — METHYLPREDNISOLONE ACETATE 40 MG/ML IJ SUSP
80.0000 mg | INTRAMUSCULAR | Status: AC | PRN
  Administered 2016-11-21: 80 mg
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
## 2016-11-25 LAB — BODY FLUID CULTURE
Gram Stain: NONE SEEN
Organism ID, Bacteria: NO GROWTH

## 2016-12-04 ENCOUNTER — Encounter (INDEPENDENT_AMBULATORY_CARE_PROVIDER_SITE_OTHER): Payer: Self-pay | Admitting: Orthopaedic Surgery

## 2016-12-04 ENCOUNTER — Ambulatory Visit (INDEPENDENT_AMBULATORY_CARE_PROVIDER_SITE_OTHER): Admitting: Orthopaedic Surgery

## 2016-12-04 VITALS — BP 139/87 | HR 63 | Ht 64.0 in | Wt 230.0 lb

## 2016-12-04 DIAGNOSIS — M25561 Pain in right knee: Secondary | ICD-10-CM

## 2016-12-04 DIAGNOSIS — M94261 Chondromalacia, right knee: Secondary | ICD-10-CM
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
## 2016-12-05 ENCOUNTER — Ambulatory Visit (INDEPENDENT_AMBULATORY_CARE_PROVIDER_SITE_OTHER): Payer: Self-pay | Admitting: Orthopaedic Surgery

## 2017-02-05 ENCOUNTER — Encounter (INDEPENDENT_AMBULATORY_CARE_PROVIDER_SITE_OTHER): Payer: Self-pay | Admitting: Orthopaedic Surgery

## 2017-02-05 ENCOUNTER — Ambulatory Visit (INDEPENDENT_AMBULATORY_CARE_PROVIDER_SITE_OTHER): Admitting: Orthopaedic Surgery

## 2017-02-05 VITALS — BP 134/84 | HR 61 | Ht 64.0 in | Wt 216.0 lb

## 2017-02-05 DIAGNOSIS — T148XXA Other injury of unspecified body region, initial encounter: Secondary | ICD-10-CM

## 2017-02-05 DIAGNOSIS — M25561 Pain in right knee: Secondary | ICD-10-CM
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
## 2017-02-16 DIAGNOSIS — Z23 Encounter for immunization: Secondary | ICD-10-CM | POA: Diagnosis not present

## 2017-04-04 DIAGNOSIS — R7309 Other abnormal glucose: Secondary | ICD-10-CM | POA: Diagnosis not present

## 2017-04-04 DIAGNOSIS — E7849 Other hyperlipidemia: Secondary | ICD-10-CM | POA: Diagnosis not present

## 2017-04-04 DIAGNOSIS — I872 Venous insufficiency (chronic) (peripheral): Secondary | ICD-10-CM | POA: Diagnosis not present

## 2017-04-04 DIAGNOSIS — I10 Essential (primary) hypertension: Secondary | ICD-10-CM | POA: Diagnosis not present

## 2017-06-06 DIAGNOSIS — N3942 Incontinence without sensory awareness: Secondary | ICD-10-CM | POA: Diagnosis not present

## 2017-06-06 DIAGNOSIS — B9620 Unspecified Escherichia coli [E. coli] as the cause of diseases classified elsewhere: Secondary | ICD-10-CM | POA: Diagnosis not present

## 2017-06-06 DIAGNOSIS — N3946 Mixed incontinence: Secondary | ICD-10-CM | POA: Diagnosis not present

## 2017-06-06 DIAGNOSIS — N390 Urinary tract infection, site not specified: Secondary | ICD-10-CM | POA: Diagnosis not present

## 2017-07-05 DIAGNOSIS — I10 Essential (primary) hypertension: Secondary | ICD-10-CM | POA: Diagnosis not present

## 2017-07-05 DIAGNOSIS — R7309 Other abnormal glucose: Secondary | ICD-10-CM | POA: Diagnosis not present

## 2017-07-05 DIAGNOSIS — E7849 Other hyperlipidemia: Secondary | ICD-10-CM | POA: Diagnosis not present

## 2017-07-05 DIAGNOSIS — E038 Other specified hypothyroidism: Secondary | ICD-10-CM | POA: Diagnosis not present

## 2017-07-05 DIAGNOSIS — Z1389 Encounter for screening for other disorder: Secondary | ICD-10-CM | POA: Diagnosis not present

## 2017-10-04 DIAGNOSIS — E038 Other specified hypothyroidism: Secondary | ICD-10-CM | POA: Diagnosis not present

## 2017-10-04 DIAGNOSIS — I10 Essential (primary) hypertension: Secondary | ICD-10-CM | POA: Diagnosis not present

## 2017-10-04 DIAGNOSIS — R7309 Other abnormal glucose: Secondary | ICD-10-CM | POA: Diagnosis not present

## 2017-10-04 DIAGNOSIS — R82998 Other abnormal findings in urine: Secondary | ICD-10-CM | POA: Diagnosis not present

## 2017-10-04 DIAGNOSIS — Z0000 Encounter for general adult medical examination without abnormal findings: Secondary | ICD-10-CM | POA: Diagnosis not present

## 2017-10-11 DIAGNOSIS — R7309 Other abnormal glucose: Secondary | ICD-10-CM | POA: Diagnosis not present

## 2017-10-11 DIAGNOSIS — R0602 Shortness of breath: Secondary | ICD-10-CM | POA: Diagnosis not present

## 2017-10-11 DIAGNOSIS — I10 Essential (primary) hypertension: Secondary | ICD-10-CM | POA: Diagnosis not present

## 2017-10-11 DIAGNOSIS — Z0000 Encounter for general adult medical examination without abnormal findings: Secondary | ICD-10-CM | POA: Diagnosis not present

## 2017-10-11 DIAGNOSIS — E7849 Other hyperlipidemia: Secondary | ICD-10-CM | POA: Diagnosis not present

## 2017-11-12 DIAGNOSIS — Z1231 Encounter for screening mammogram for malignant neoplasm of breast: Secondary | ICD-10-CM | POA: Diagnosis not present

## 2017-12-06 DIAGNOSIS — Z130 Encounter for screening for diseases of the blood and blood-forming organs and certain disorders involving the immune mechanism: Secondary | ICD-10-CM | POA: Diagnosis not present

## 2017-12-06 DIAGNOSIS — Z1389 Encounter for screening for other disorder: Secondary | ICD-10-CM | POA: Diagnosis not present

## 2017-12-06 DIAGNOSIS — Z01419 Encounter for gynecological examination (general) (routine) without abnormal findings: Secondary | ICD-10-CM | POA: Diagnosis not present

## 2017-12-06 DIAGNOSIS — R82998 Other abnormal findings in urine: Secondary | ICD-10-CM | POA: Diagnosis not present

## 2017-12-06 DIAGNOSIS — Z6838 Body mass index (BMI) 38.0-38.9, adult: Secondary | ICD-10-CM | POA: Diagnosis not present

## 2017-12-09 ENCOUNTER — Emergency Department (HOSPITAL_COMMUNITY): Payer: Federal, State, Local not specified - PPO

## 2017-12-09 ENCOUNTER — Observation Stay (HOSPITAL_COMMUNITY)
Admission: EM | Admit: 2017-12-09 | Discharge: 2017-12-10 | Disposition: A | Discharge status: Home / Self Care | Payer: Federal, State, Local not specified - PPO | Attending: Oncology | Admitting: Oncology

## 2017-12-09 ENCOUNTER — Encounter (HOSPITAL_COMMUNITY): Payer: Self-pay | Admitting: Emergency Medicine

## 2017-12-09 ENCOUNTER — Other Ambulatory Visit: Payer: Self-pay

## 2017-12-09 DIAGNOSIS — Z87828 Personal history of other (healed) physical injury and trauma: Secondary | ICD-10-CM

## 2017-12-09 DIAGNOSIS — F419 Anxiety disorder, unspecified: Secondary | ICD-10-CM | POA: Insufficient documentation

## 2017-12-09 DIAGNOSIS — J810 Acute pulmonary edema: Secondary | ICD-10-CM | POA: Diagnosis not present

## 2017-12-09 DIAGNOSIS — Z872 Personal history of diseases of the skin and subcutaneous tissue: Secondary | ICD-10-CM

## 2017-12-09 DIAGNOSIS — I878 Other specified disorders of veins: Secondary | ICD-10-CM | POA: Diagnosis not present

## 2017-12-09 DIAGNOSIS — Z79899 Other long term (current) drug therapy: Secondary | ICD-10-CM | POA: Diagnosis not present

## 2017-12-09 DIAGNOSIS — R0602 Shortness of breath: Secondary | ICD-10-CM | POA: Diagnosis not present

## 2017-12-09 DIAGNOSIS — E785 Hyperlipidemia, unspecified: Secondary | ICD-10-CM | POA: Diagnosis not present

## 2017-12-09 DIAGNOSIS — Z8742 Personal history of other diseases of the female genital tract: Secondary | ICD-10-CM

## 2017-12-09 DIAGNOSIS — Z86718 Personal history of other venous thrombosis and embolism: Secondary | ICD-10-CM | POA: Diagnosis not present

## 2017-12-09 DIAGNOSIS — Z9889 Other specified postprocedural states: Secondary | ICD-10-CM | POA: Insufficient documentation

## 2017-12-09 DIAGNOSIS — I2699 Other pulmonary embolism without acute cor pulmonale: Secondary | ICD-10-CM | POA: Diagnosis not present

## 2017-12-09 DIAGNOSIS — Z8781 Personal history of (healed) traumatic fracture: Secondary | ICD-10-CM

## 2017-12-09 DIAGNOSIS — E782 Mixed hyperlipidemia: Secondary | ICD-10-CM

## 2017-12-09 DIAGNOSIS — R0789 Other chest pain: Secondary | ICD-10-CM | POA: Diagnosis not present

## 2017-12-09 DIAGNOSIS — Z8249 Family history of ischemic heart disease and other diseases of the circulatory system: Secondary | ICD-10-CM

## 2017-12-09 LAB — BASIC METABOLIC PANEL
ANION GAP: 10 (ref 5–15)
BUN: 13 mg/dL (ref 6–20)
CHLORIDE: 105 mmol/L (ref 98–111)
CO2: 25 mmol/L (ref 22–32)
CREATININE: 1 mg/dL (ref 0.44–1.00)
Calcium: 8.9 mg/dL (ref 8.9–10.3)
GFR calc non Af Amer: 60 mL/min — ABNORMAL LOW (ref >60)
Glucose, Bld: 113 mg/dL — ABNORMAL HIGH (ref 70–99)
POTASSIUM: 4 mmol/L (ref 3.5–5.1)
Sodium: 140 mmol/L (ref 135–145)

## 2017-12-09 LAB — D-DIMER, QUANTITATIVE (NOT AT ARMC): D DIMER QUANT: 7.66 ug{FEU}/mL — AB (ref 0.00–0.50)

## 2017-12-09 LAB — CBC
HEMATOCRIT: 43.1 % (ref 36.0–46.0)
HEMOGLOBIN: 13.9 g/dL (ref 12.0–15.0)
MCH: 28 pg (ref 26.0–34.0)
MCHC: 32.3 g/dL (ref 30.0–36.0)
MCV: 86.7 fL (ref 78.0–100.0)
Platelets: 290 10*3/uL (ref 150–400)
RBC: 4.97 MIL/uL (ref 3.87–5.11)
RDW: 12.9 % (ref 11.5–15.5)
WBC: 10.3 10*3/uL (ref 4.0–10.5)

## 2017-12-09 LAB — I-STAT TROPONIN, ED
TROPONIN I, POC: 0.03 ng/mL (ref 0.00–0.08)
Troponin i, poc: 0.07 ng/mL (ref 0.00–0.08)

## 2017-12-09 LAB — HEPARIN LEVEL (UNFRACTIONATED): HEPARIN UNFRACTIONATED: 0.65 [IU]/mL (ref 0.30–0.70)

## 2017-12-09 MED ORDER — IOPAMIDOL (ISOVUE-370) INJECTION 76%
100.0000 mL | Freq: Once | INTRAVENOUS | Status: AC | PRN
  Administered 2017-12-09: 100 mL via INTRAVENOUS

## 2017-12-09 MED ORDER — HEPARIN (PORCINE) IN NACL 100-0.45 UNIT/ML-% IJ SOLN
1350.0000 [IU]/h | INTRAMUSCULAR | Status: DC
  Administered 2017-12-09: 1350 [IU]/h via INTRAVENOUS
  Filled 2017-12-09: qty 250

## 2017-12-09 MED ORDER — ATORVASTATIN CALCIUM 20 MG PO TABS
20.0000 mg | ORAL_TABLET | Freq: Every day | ORAL | Status: DC
  Administered 2017-12-10: 20 mg via ORAL
  Filled 2017-12-09 (×2): qty 1

## 2017-12-09 MED ORDER — HEPARIN BOLUS VIA INFUSION
5000.0000 [IU] | Freq: Once | INTRAVENOUS | Status: AC
  Administered 2017-12-09: 5000 [IU] via INTRAVENOUS
  Filled 2017-12-09: qty 5000

## 2017-12-09 NOTE — ED Triage Notes (Signed)
Onset today 0745 after patient got to work developed shortness of breath. States had a few episodes last week and resolved. Concerned about the frequency of shortness of breath. States briefly had chest pain when trying to take a deep breath today. Since then feels better denies chest pain and sob has improved.

## 2017-12-09 NOTE — ED Notes (Signed)
Pt wanted to walk to room, did not want a wheel chair

## 2017-12-09 NOTE — ED Notes (Signed)
This rn attempted iv access without success, another rn to try

## 2017-12-09 NOTE — ED Notes (Signed)
Iv team at bedside  

## 2017-12-09 NOTE — ED Provider Notes (Signed)
[facility] EMERGENCY DEPARTMENT Provider Note   CSN: [id number] Arrival date & time: 12/09/17  0908     History   Chief Complaint Chief Complaint  Patient presents with  . Shortness of Breath    HPI Claudia Holmes is a 62 y.o. female.  Claudia Holmes is a [name] with a history of high cholesterol and previous DVT, presents to the emergency department for evaluation of shortness of breath.  She reports she had an acute episode of shortness of breath that started about 745 this morning upon arriving at work.  She reports she has had some similar but less severe episodes intermittently over the past week that have resolved spontaneously on their own, but the one that she had this morning seem to last a bit longer.  She reports she had pain when taking a deep breath and felt like she could not get enough air and when she was taking a deep breath.  She reports some brief chest discomfort associated with this.  She feels that the shortness of breath and chest pain has improved since arriving at the hospital but is concerned this is happened multiple times in the past week.  She is currently chest pain-free and breathing comfortably.  No associated cough, fevers or chills.  No abdominal pain, nausea or vomiting.  Patient has not noticed any pain or swelling in her lower extremities.  She does report a few years ago she had a DVT and was put on blood thinners for about 3 months, she is not currently on any blood thinners.  No history of recent immobilization or surgery, she is not currently on any estrogen therapy or other hormones.  Remote smoking history, not a current smoker.  No history of reactive airway disease.     Past Medical History:  Diagnosis Date  . Anxiety   . High cholesterol     There are no active problems to display for this patient.   Past Surgical History:  Procedure Laterality Date  . arm surgery    . LEG SURGERY       OB History    None      Home Medications    Prior to Admission medications   Medication Sig Start Date End Date Taking? Authorizing Provider  ALPRAZolam Duanne Moron) 1 MG tablet Take 1 mg by mouth at bedtime as needed for sleep.    Yes [provider]  atorvastatin (LIPITOR) 20 MG tablet Take 20 mg by mouth daily.  12/11/13  Yes [provider]  OVER THE COUNTER MEDICATION See admin instructions. Doterra Essential Oils for pain (topically and orally) and bladder control (topically)   Yes [provider]  aspirin 81 MG chewable tablet Chew 1 tablet (81 mg total) by mouth daily. Patient not taking: Reported on 11/20/2016 04/26/14   Ernestina Patches, MD  sulfamethoxazole-trimethoprim (BACTRIM DS,SEPTRA DS) 800-160 MG tablet Take 1 tablet by mouth 2 (two) times daily. 3 day course ordered 12/09/17    [provider]  triamcinolone cream (KENALOG) 0.1 % Apply 1 application topically 2 (two) times daily. Patient not taking: Reported on 12/04/2016 03/05/15   Janne Napoleon, NP    Family History No family history on file.  Social History Social History   Tobacco Use  . Smoking status: Never Smoker  . Smokeless tobacco: Never Used  Substance Use Topics  . Alcohol use: Never    Frequency: Never  . Drug use: Never  Allergies   Patient has no known allergies.   Review of Systems Review of Systems  Constitutional: Negative for chills and fever.  HENT: Negative for congestion, rhinorrhea and sore throat.   Eyes: Negative for visual disturbance.  Respiratory: Positive for chest tightness and shortness of breath. Negative for cough and wheezing.   Cardiovascular: Positive for chest pain. Negative for palpitations and leg swelling.  Gastrointestinal: Negative for abdominal pain, nausea and vomiting.  Genitourinary: Negative for dysuria and frequency.  Musculoskeletal: Negative for arthralgias and myalgias.  Skin: Negative for color change and rash.  Neurological: Negative  for dizziness, syncope and light-headedness.     Physical Exam Updated Vital Signs BP (!) 145/103   Pulse 86   Temp 98.7 F (37.1 C) (Oral)   Resp (!) 22   Ht 5\' 4"  (1.626 m)   Wt 98 kg (216 lb)   SpO2 95%   BMI 37.08 kg/m   Physical Exam  Constitutional: She is oriented to person, place, and time. She appears well-developed and well-nourished.  Non-toxic appearance. She does not appear ill. No distress.  HENT:  Head: Normocephalic and atraumatic.  Mouth/Throat: Oropharynx is clear and moist.  Eyes: Pupils are equal, round, and reactive to light. EOM are normal. Right eye exhibits no discharge. Left eye exhibits no discharge.  Neck: Normal range of motion. Neck supple.  Cardiovascular: Normal rate, regular rhythm, normal heart sounds and intact distal pulses.  Pulmonary/Chest: Effort normal. No respiratory distress. She exhibits no tenderness.  On room air and in no acute distress.  Respirations equal and unlabored, patient able to speak in full sentences, lungs clear to auscultation bilaterally  Abdominal: Soft. Bowel sounds are normal. She exhibits no distension and no mass. There is no tenderness. There is no guarding.  Abdomen soft, nondistended, nontender to palpation in all quadrants without guarding or peritoneal signs  Musculoskeletal: She exhibits no edema or deformity.  Bilateral lower extremities without edema, erythema or tenderness  Neurological: She is alert and oriented to person, place, and time. Coordination normal.  Speech is clear, able to follow commands CN III-XII intact Normal strength in upper and lower extremities bilaterally including dorsiflexion and plantar flexion, strong and equal grip strength Sensation normal to light and sharp touch Moves extremities without ataxia, coordination intact Normal finger to nose and rapid alternating movements No pronator drift  Skin: Skin is warm and dry. Capillary refill takes less than 2 seconds. She is not  diaphoretic.  Psychiatric: She has a normal mood and affect. Her behavior is normal.  Nursing note and vitals reviewed.    ED Treatments / Results  Labs (all labs ordered are listed, but only abnormal results are displayed) Labs Reviewed  BASIC METABOLIC PANEL - Abnormal; Notable for the following components:      Result Value   Glucose, Bld 113 (*)    GFR calc non Af Amer 60 (*)    All other components within normal limits  D-DIMER, QUANTITATIVE (NOT AT [facility]) - Abnormal; Notable for the following components:   D-Dimer, Quant 7.66 (*)    All other components within normal limits  CBC  HEPARIN LEVEL (UNFRACTIONATED)  HEPARIN LEVEL (UNFRACTIONATED)  CBC  I-STAT TROPONIN, ED  I-STAT TROPONIN, ED    EKG EKG Interpretation  Date/Time:  Monday December 09 2017 09:17:37 EDT Ventricular Rate:  84 PR Interval:  154 QRS Duration: 80 QT Interval:  392 QTC Calculation: 463 R Axis:   -23 Text Interpretation:  Normal sinus rhythm Low  voltage QRS Cannot rule out Anterior infarct , age undetermined Abnormal ECG Confirmed by Gerlene Fee [phone]) on 12/09/2017 4:54:37 PM   Radiology Dg Chest 2 View  Result Date: 12/09/2017 CLINICAL DATA:  Shortness of breath EXAM: CHEST - 2 VIEW COMPARISON:  04/25/2014 FINDINGS: The heart size and mediastinal contours are within normal limits. Both lungs are clear. The visualized skeletal structures are unremarkable. IMPRESSION: No active cardiopulmonary disease. Electronically Signed   By: Kathreen Devoid   On: 12/09/2017 09:49   Ct Angio Chest Pe W And/or Wo Contrast  Result Date: 12/09/2017 CLINICAL DATA:  Shortness of breath and diaphoresis with positive D-dimer. EXAM: CT ANGIOGRAPHY CHEST WITH CONTRAST TECHNIQUE: Multidetector CT imaging of the chest was performed using the standard protocol during bolus administration of intravenous contrast. Multiplanar CT image reconstructions and MIPs were obtained to evaluate the vascular anatomy. CONTRAST:  185mL  ISOVUE-370 IOPAMIDOL (ISOVUE-370) INJECTION 76% COMPARISON:  CT chest 04/10/2010 FINDINGS: Cardiovascular: --Pulmonary arteries: Contrast injection is sufficient to demonstrate satisfactory opacification of the pulmonary arteries to the segmental level.There are bilateral central pulmonary emboli, right worse than left. On the right, there are filling defects within the main right pulmonary artery that extend into all of the proximal lobar branches. On the left, there are smaller defects within the proximal lobar branches. There is no evidence of right heart strain by CT criteria. The RV/LV ratio is slightly less than 1. The main pulmonary artery is within normal limits for size. No pulmonary infarction. --Aorta: Satisfactory opacification of the thoracic aorta. No aortic dissection or other acute aortic syndrome. Conventional 3 vessel aortic branching pattern. The aortic course and caliber are normal. There is no aortic atherosclerosis. --Heart: Mild cardiomegaly. No pericardial effusion. Mediastinum/Nodes: No mediastinal, hilar or axillary lymphadenopathy. The visualized thyroid and thoracic esophageal course are unremarkable. Lungs/Pleura: No pulmonary nodules or masses. No pleural effusion or pneumothorax. No focal airspace consolidation. No focal pleural abnormality. Right lung base atelectasis. Upper Abdomen: Contrast bolus timing is not optimized for evaluation of the abdominal organs. Within this limitation, the visualized organs of the upper abdomen are normal. Musculoskeletal: No chest wall abnormality. No acute or significant osseous findings. Review of the MIP images confirms the above findings. IMPRESSION: 1. Bilateral central pulmonary emboli, right greater than left, without right heart strain by CT measurement of RV/LV ratio. 2. No pulmonary infarction. 3. No acute aortic syndrome. Critical Value/emergent results were called by telephone at the time of interpretation on 12/09/2017 at 4:45 pm to [facility] , who verbally acknowledged these results. Electronically Signed   By: Ulyses Jarred M.D.   On: 12/09/2017 16:54    Procedures .Critical Care Performed by: Jacqlyn Larsen, PA-C Authorized by: Jacqlyn Larsen, PA-C   Critical care provider statement:    Critical care time (minutes):  60   Critical care start time:  12/09/2017 12:00 PM   Critical care end time:  12/09/2017 1:00 PM   Critical care time was exclusive of:  Separately billable procedures and treating other patients   Critical care was necessary to treat or prevent imminent or life-threatening deterioration of the following conditions:  Circulatory failure and respiratory failure   Critical care was time spent personally by me on the following activities:  Development of treatment plan with patient or surrogate, discussions with consultants, obtaining history from patient or surrogate, ordering and performing treatments and interventions, ordering and review of laboratory studies, ordering and review of radiographic studies, pulse oximetry, review of old charts  and re-evaluation of patient's condition   (including critical care time)  Medications Ordered in ED Medications  heparin ADULT infusion 100 units/mL (25000 units/249mL sodium chloride 0.45%) (1,350 Units/hr Intravenous New Bag/Given 12/09/17 1750)  iopamidol (ISOVUE-370) 76 % injection 100 mL (100 mLs Intravenous Contrast Given 12/09/17 1621)  heparin bolus via infusion 5,000 Units (5,000 Units Intravenous Bolus from Bag 12/09/17 1751)     Initial Impression / Assessment and Plan / ED Course  I have reviewed the triage vital signs and the nursing notes.  Pertinent labs & imaging results that were available during my care of the patient were reviewed by me and considered in my medical decision making (see chart for details).  Patient presents for evaluation of shortness of breath, she had a few episodes over the last week but it acutely worsened this morning upon arrival to  work.  History of prior DVT, not currently on any anticoagulants.  No prior history of ACS.  She reports some chest tightness associated with the shortness of breath and pleuritic chest pain.  Mildly hypertensive but vitals otherwise normal and patient appears to be in no acute distress on initial evaluation.  Lungs clear and heart with regular rate and rhythm.  Abdomen benign.  Given patient's history of DVT, there is concern for PE causing patient's symptoms, will get typical cardiac work-up will as d-dimer, she is comfortable and in no acute distress at this time.  Troponin negative, EKG without concerning ischemic changes and no signs of right heart strain, no acute electrolyte derangements requiring intervention.  D-dimer is significantly elevated at 7.66 raising further concern for PE.  Will get CT angio of the chest.  4:45 PM called by radiology regarding chest CT, there is evidence of bilateral pulmonary embolisms, worse on the right than the left, but there is no obvious evidence of right heart strain on CT.  Repeat troponin continues to be negative.  Patient has maintained stable vitals, intermittently mildly hypertensive, but not hypoxic or tachypneic.  No tachycardia.  Heparin ordered per pharmacy consult.  Will consult for medical admission.  Spoke with Dr. Hetty Ely with internal medicine teaching service, they will plan to see and admit the patient.  Final Clinical Impressions(s) / ED Diagnoses   Final diagnoses:  Pulmonary embolism, bilateral [facility])    ED Discharge Orders    None       Jacqlyn Larsen, [place] 12/09/17 1811    Maudie Flakes, MD 12/09/17 2303

## 2017-12-09 NOTE — ED Notes (Signed)
Unsuccessful attempt at IV placement by this RN, IV team consulted.

## 2017-12-09 NOTE — ED Notes (Signed)
Pt returned from CT with infiltrated IV, STAT IV team consulted to start heparin

## 2017-12-09 NOTE — ED Notes (Signed)
Patient transported to CT 

## 2017-12-09 NOTE — H&P (Signed)
Date: 12/09/2017               Patient Name:  Claudia Holmes MRN: [id number]  DOB: [birth year] Age / Sex: 62 y.o., female   PCP: Leanna Battles, MD         Medical Service: Internal Medicine Teaching Service         Attending Physician: Dr. Sedonia Small, Barth Kirks, MD    First Contact: Dr. Donne Hazel Pager: [phone]  Second Contact: Dr. Hetty Ely  Pager: [phone]       After Hours (After 5p/  First Contact Pager: [phone]  weekends / holidays): Second Contact Pager: [phone]   Chief Complaint: shortness of breath and sweating   History of Present Illness: Claudia Holmes is a 62yo female with history of HLD, bilateral lower extremity skin grafts, right baker's cyst complicated by DVT who presents with 5 days of dyspnea and sweating on exertion. She also reports associated chest discomfort. 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 Over the last 5 days, her symptoms have not gotten any better or worse so she called her PCP who told her to come to the ER.   In the ED, her D-dimer was elevated and CTA showed bilateral central pulmonary emboli, right > left, without right heart strain. She was started on IV heparin   She is up to date on screening mammograms, colonoscopy, and pap smears with no history of abnormal results. Last colonoscopy was 2015 and patient reports the next one is due in 2020. Unable to see report in Epic.   She is saturating well on room air and denies any current discomfort.   She has a history of a crushing bilateral leg injury from a work accident and had bilateral skin grafts placed. The grafts took the first time and she was able to regain her function/strength and return to work.  1-2 years ago she had a DVT in her right leg. She had a Baker's cyst behind right knee and on ultrasound they found a DVT. It was classified as unprovoked and she was put on Xarelto for 3 months.   Review of systems negative for cough, fevers,  n/v/d, changes in appetite or weight loss.   Meds:  Current Meds  Medication Sig  . ALPRAZolam (XANAX) 1 MG tablet Take 1 mg by mouth at bedtime as needed for sleep.   Marland Kitchen atorvastatin (LIPITOR) 20 MG tablet Take 20 mg by mouth daily.   Marland Kitchen OVER THE COUNTER MEDICATION See admin instructions. Doterra Essential Oils for pain (topically and orally) and bladder control (topically)     Allergies: Allergies as of 12/09/2017  . (No Known Allergies)   Past Medical History:  Diagnosis Date  . Anxiety   . 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 Non smoker, denies alcohol or drug use.   Surgical History: broken right arm, bilateral lower extremity skin grafts  Review of Systems: A complete ROS was negative except as per HPI.   Physical Exam: Blood pressure 116/85, pulse 81, temperature 98.7 F (37.1 C), temperature source Oral, resp. rate 19, height 5\' 4"  (1.626 m), weight 216 lb (98 kg), SpO2 99 %. Vitals:   12/09/17 1300 12/09/17 1430 12/09/17 1748 12/09/17 1800  BP: (!) 143/78 (!) 145/103 Marland Kitchen)  154/86 116/85  Pulse: 83 86 79 81  Resp: 19 (!) 22 16 19   Temp:      TempSrc:      SpO2: 94% 95% 98% 99%  Weight:      Height:       General: Vital signs reviewed.  Patient is well-developed and well-nourished, in no acute distress and cooperative with exam.  Head: Normocephalic and atraumatic. Eyes: EOMI, conjunctivae normal, no scleral icterus.  Neck: Supple, trachea midline, normal ROM, no JVD, masses, thyromegaly, or carotid bruit present. No lymphadenopathy  Cardiovascular: RRR, S1 normal, S2 normal, no murmurs, gallops, or rubs. Pulmonary/Chest: Clear to auscultation bilaterally, no wheezes, rales, or rhonchi. Abdominal: Soft, non-tender, non-distended, BS +, no masses, organomegaly, or guarding present.  Musculoskeletal: No joint deformities,  erythema, or stiffness, ROM full and nontender. Well healed skin grafts on bilateral legs Extremities: No lower extremity edema bilaterally,  pulses symmetric and intact bilaterally. No cyanosis or clubbing. Neurological: A&O x3, Strength is normal and symmetric bilaterally, cranial nerve II-XII are grossly intact, no focal motor deficit, sensory intact to light touch bilaterally.  Skin: Warm, dry and intact. No rashes or erythema. Psychiatric: Normal mood and affect. speech and behavior is normal. Cognition and memory are normal.    EKG: personally reviewed my interpretation is normal sinus rhythm   CXR: personally reviewed my interpretation is no active cardiopulmonary disease   CTA: IMPRESSION: 1. Bilateral central pulmonary emboli, right greater than left, without right heart strain by CT measurement of RV/LV ratio. 2. No pulmonary infarction. 3. No acute aortic syndrome.  Assessment & Plan by Problem: Active Problems:   Pulmonary emboli (HCC)   Hyperlipidemia  Pulmonary Embolism, unprovoked: No recent surgeries or travel. Up to date on cancer screenings (breast, colon, cervical) although no reports/imaging are available in epic. Dad with multiple pulmonary emboli.   - Continue IV heparin, will transition to po tomorrow (either warfarin or xarelto)  - Monitor telemetry  - Difficult to order any hypercoag testing given heparin already started - Per epic, last colonoscopy 2015 but unable to see report. Patient reports 5 year interval for colonoscopy. This is not the usual interval, which would be 10 years. Unable to obtain records. Patient is well nourished which decreases likelihood of malignancy. Denies family history of cancer.  - Imaging unavailable but documentation states Mammogram 2015 negative - Report unavailable but documentation states Pap smear: 2016 ASCUS and cervical biopsy   HLD: - Continue home Lipitor 20mg  qd  Dispo: Admit patient to Inpatient with expected length  of stay greater than 2 midnights.  Signed: Isabelle Course, MD 12/09/2017, 7:27 PM  Pager: [phone]

## 2017-12-09 NOTE — Progress Notes (Signed)
ANTICOAGULATION CONSULT NOTE - Initial Consult  Pharmacy Consult for Heparin Indication: pulmonary embolus  No Known Allergies  Patient Measurements: Height: 5\' 4"  (162.6 cm) Weight: 216 lb (98 kg) IBW/kg (Calculated) : 54.7 Heparin Dosing Weight: 77.3 kg  Vital Signs: Temp: 98.7 F (37.1 C) (08/05 0922) Temp Source: Oral (08/05 0922) BP: 143/78 (08/05 1300) Pulse Rate: 83 (08/05 1300)  Labs: Recent Labs    12/09/17 0938  HGB 13.9  HCT 43.1  PLT 290  CREATININE 1.00    Estimated Creatinine Clearance: 68 mL/min (by C-G formula based on SCr of 1 mg/dL).   Medical History: Past Medical History:  Diagnosis Date  . Anxiety   . High cholesterol     Assessment: 62 YO F with PMH of high cholesterol and previous DVT a few years ago (date not reported) not on anticoagulation PTA presenting with SOB. Bilateral central PE with no right heart strain present on chest CT.   CBC stable, Scr at baseline, no bleeding reported.  Goal of Therapy:  Heparin level 0.3-0.7 units/ml Monitor platelets by anticoagulation protocol: Yes   Plan:  Give 5000 units bolus x 1 Start heparin infusion at 1350 units/hr  Monitor heparin level in 6 hours Monitor s/sx of bleeding, CBC, and heparin level daily   Juanell Fairly, PharmD PGY1 Pharmacy Resident Phone [phone] 12/09/2017 5:24 PM

## 2017-12-10 ENCOUNTER — Encounter: Payer: Self-pay | Admitting: Pharmacist

## 2017-12-10 ENCOUNTER — Ambulatory Visit (HOSPITAL_BASED_OUTPATIENT_CLINIC_OR_DEPARTMENT_OTHER): Payer: Federal, State, Local not specified - PPO

## 2017-12-10 DIAGNOSIS — Z9889 Other specified postprocedural states: Secondary | ICD-10-CM | POA: Diagnosis not present

## 2017-12-10 DIAGNOSIS — I878 Other specified disorders of veins: Secondary | ICD-10-CM | POA: Diagnosis not present

## 2017-12-10 DIAGNOSIS — E785 Hyperlipidemia, unspecified: Secondary | ICD-10-CM | POA: Diagnosis not present

## 2017-12-10 DIAGNOSIS — I2699 Other pulmonary embolism without acute cor pulmonale: Secondary | ICD-10-CM | POA: Diagnosis not present

## 2017-12-10 DIAGNOSIS — Z7901 Long term (current) use of anticoagulants: Secondary | ICD-10-CM

## 2017-12-10 LAB — CBC
HEMATOCRIT: 41.1 % (ref 36.0–46.0)
Hemoglobin: 13.5 g/dL (ref 12.0–15.0)
MCH: 28.2 pg (ref 26.0–34.0)
MCHC: 32.8 g/dL (ref 30.0–36.0)
MCV: 86 fL (ref 78.0–100.0)
Platelets: 244 10*3/uL (ref 150–400)
RBC: 4.78 MIL/uL (ref 3.87–5.11)
RDW: 12.8 % (ref 11.5–15.5)
WBC: 10.5 10*3/uL (ref 4.0–10.5)

## 2017-12-10 LAB — HIV ANTIBODY (ROUTINE TESTING W REFLEX): HIV Screen 4th Generation wRfx: NONREACTIVE

## 2017-12-10 LAB — HEPARIN LEVEL (UNFRACTIONATED): HEPARIN UNFRACTIONATED: 0.55 [IU]/mL (ref 0.30–0.70)

## 2017-12-10 MED ORDER — RIVAROXABAN 20 MG PO TABS
20.0000 mg | ORAL_TABLET | Freq: Every day | ORAL | Status: DC
Start: 2017-12-31 — End: 2017-12-10

## 2017-12-10 MED ORDER — IBUPROFEN 200 MG PO TABS
400.0000 mg | ORAL_TABLET | Freq: Once | ORAL | Status: AC
  Administered 2017-12-10: 400 mg via ORAL
  Filled 2017-12-10: qty 2

## 2017-12-10 MED ORDER — RIVAROXABAN 15 MG PO TABS: 15.0000 mg | ORAL_TABLET | Freq: Two times a day (BID) | ORAL | 0 refills | Status: DC

## 2017-12-10 MED ORDER — RIVAROXABAN 15 MG PO TABS
15.0000 mg | ORAL_TABLET | Freq: Two times a day (BID) | ORAL | Status: DC
  Administered 2017-12-10 (×2): 15 mg via ORAL
  Filled 2017-12-10 (×2): qty 1

## 2017-12-10 MED ORDER — RIVAROXABAN 20 MG PO TABS: 20.0000 mg | ORAL_TABLET | Freq: Every day | ORAL | 11 refills | Status: DC

## 2017-12-10 NOTE — Progress Notes (Signed)
Patient complaining of intermittent sharp lighting pain to the right neck, no complaints of the pain radiating or chest pain, no complaints of shortness of breath, paged [facility] for orders Neta Mends RN 12:16 AM 12-09-2017

## 2017-12-10 NOTE — Discharge Instructions (Addendum)
[name], Holmes were admitted to the hospital for a blood clot in your lungs. We treated you with intravenous heparin which served to break down the clot. It will be important for you to continue taking Xarelto for the indefinite future to prevent future blood clots. The main risk of this medicine is bleeding. Please call and schedule an appointment with your primary doctor, Dr. Philip Aspen. Dr. Beryle Beams "Dr. Darnell Level" would also like you to follow up in clinic with him so he can get some other lab tests to look for inherited clotting diseases. I have called his office and left a voicemail for them to schedule you an appointment. If you don't hear from them in a week, call [phone] and inquire about your appointment with Dr. Darnell Level.  It was a pleasure taking care of you! Below you will find more information on Xarelto.  Information on my medicine - XARELTO (rivaroxaban)  This medication education was reviewed with me or my healthcare representative as part of my discharge preparation.  WHY WAS XARELTO PRESCRIBED FOR YOU? Xarelto was prescribed to treat blood clots that may have been found in the veins of your legs (deep vein thrombosis) or in your lungs (pulmonary embolism) and to reduce the risk of them occurring again.  What do you need to know about Xarelto? The starting dose is one 15 mg tablet taken TWICE daily with food for the FIRST 21 DAYS then on (enter date)  12/31/2017  the dose is changed to one 20 mg tablet taken ONCE A DAY with your evening meal.  DO NOT stop taking Xarelto without talking to the health care provider who prescribed the medication.  Refill your prescription for 20 mg tablets before you run out.  After discharge, you should have regular check-up appointments with your healthcare provider that is prescribing your Xarelto.  In the future your dose may need to be changed if your kidney function changes by a significant amount.  What do you do if you miss a dose? If you  are taking Xarelto TWICE DAILY and you miss a dose, take it as soon as you remember. You may take two 15 mg tablets (total 30 mg) at the same time then resume your regularly scheduled 15 mg twice daily the next day.  If you are taking Xarelto ONCE DAILY and you miss a dose, take it as soon as you remember on the same day then continue your regularly scheduled once daily regimen the next day. Do not take two doses of Xarelto at the same time.   Important Safety Information Xarelto is a blood thinner medicine that can cause bleeding. You should call your healthcare provider right away if you experience any of the following: ? Bleeding from an injury or your nose that does not stop. ? Unusual colored urine (red or dark brown) or unusual colored stools (red or black). ? Unusual bruising for unknown reasons. ? A serious fall or if you hit your head (even if there is no bleeding).  Some medicines may interact with Xarelto and might increase your risk of bleeding while on Xarelto. To help avoid this, consult your healthcare provider or pharmacist prior to using any new prescription or non-prescription medications, including herbals, vitamins, non-steroidal anti-inflammatory drugs (NSAIDs) and supplements.  This website has more information on Xarelto: [email or web address]

## 2017-12-10 NOTE — Progress Notes (Signed)
[place] for Heparin > Xarelto Indication: pulmonary embolus  No Known Allergies  Patient Measurements: Height: 5\' 4"  (162.6 cm) Weight: 222 lb 4.8 oz (100.8 kg) IBW/kg (Calculated) : 54.7 Heparin Dosing Weight: 77.3 kg  Vital Signs: Temp: 97.9 F (36.6 C) (08/06 0357) Temp Source: Oral (08/06 0357) BP: 134/76 (08/06 0357) Pulse Rate: 75 (08/06 0357)  Labs: Recent Labs    12/09/17 0938 12/09/17 2254 12/10/17 0639  HGB 13.9  --   --   HCT 43.1  --   --   PLT 290  --   --   HEPARINUNFRC  --  0.65 0.55  CREATININE 1.00  --   --     Estimated Creatinine Clearance: 69 mL/min (by C-G formula based on SCr of 1 mg/dL).   Medical History: Past Medical History:  Diagnosis Date  . Anxiety   . High cholesterol     Assessment: 62 YO F with PMH of high cholesterol and previous DVT a few years ago (date not reported) not on anticoagulation PTA presenting with SOB. Bilateral central PE with no right heart strain present on chest CT.   Pharmacy consulted to transition from heparin to Xarelto. CBC wnl, Scr at baseline, no bleeding reported.  Goal of Therapy:  VTE treatment Monitor platelets by anticoagulation protocol: Yes   Plan:  D/c heparin at time of 1st dose of Xarelto - communicated plan with RN Xarelto 15mg  PO BID with meals x 21 days; then 20mg  PO Qsupper Monitor CBC, s/sx bleeding  Elicia Lamp, PharmD, BCPS Clinical Pharmacist Please check AMION for all [facility] contact numbers 12/10/2017 7:28 AM

## 2017-12-10 NOTE — Progress Notes (Signed)
Xarelto was reviewed with the patient, including name, instructions, indication, goals of therapy, potential side effects, importance of adherence, and safe use.  Patient reports she has taken Xarelto in the past and has no concerns today. Provided free trial card and $10 copay card..  Patient verbalized understanding by repeating back information and was advised to contact me if medication-related questions arise. Patient was also provided an information handout.

## 2017-12-10 NOTE — Care Management (Signed)
12-10-17   BENEFITS CHECK :  #  6.   S/W   DARNELL # F.P.P. RX #  [phone]    XARELTO   15 MG BID COVER- YES CO-PAY- $ 55.00 TIER- NO PRIOR  APPROVAL- NO  PREFERRED PHARMACY : YES  [facility]  AND [facility]

## 2017-12-10 NOTE — Discharge Summary (Signed)
Name: Claudia Holmes MRN: [id number] DOB: 02/12/1956 62 y.o. PCP: Leanna Battles, MD  Date of Admission: 12/09/2017  9:13 AM Date of Discharge:  Attending Physician: Annia Belt, MD  Discharge Diagnosis: 1. Pulmonary embolism, unprovoked  Discharge Medications: Allergies as of 12/10/2017   No Known Allergies     Medication List    STOP taking these medications   aspirin 81 MG chewable tablet   sulfamethoxazole-trimethoprim 800-160 MG tablet Commonly known as:  BACTRIM DS,SEPTRA DS   triamcinolone cream 0.1 % Commonly known as:  KENALOG     TAKE these medications   ALPRAZolam 1 MG tablet Commonly known as:  XANAX Take 1 mg by mouth at bedtime as needed for sleep.   atorvastatin 20 MG tablet Commonly known as:  LIPITOR Take 20 mg by mouth daily.   OVER THE COUNTER MEDICATION See admin instructions. Doterra Essential Oils for pain (topically and orally) and bladder control (topically)   Rivaroxaban 15 MG Tabs tablet Commonly known as:  XARELTO Take 1 tablet (15 mg total) by mouth 2 (two) times daily with a meal.   rivaroxaban 20 MG Tabs tablet Commonly known as:  XARELTO Take 1 tablet (20 mg total) by mouth daily with supper. Start taking on:  12/31/2017       Disposition and follow-up:   Ms.Claudia Holmes was discharged from [facility] in Good condition.  At the hospital follow up visit please address:  1.  Adherence to Xarelto, she will need to be on anticoagulation for an indefinite period of time since she's had 2 unprovoked blood clots (2017 DVT right leg, 2019 PE 2/2 left leg DVT)  2.  Labs / imaging needed at time of follow-up: appointment with Dr. Darnell Level for antiphospholipid antibody syndrome workup   3.  Pending labs/ test needing follow-up:   Follow-up Appointments: Follow-up Information    Leanna Battles, MD Follow up in 1 week(s).   Specialty:  Internal Medicine Why:  Please call and make an appointment for a hospital  follow up with Dr. Debarah Crape information: [place] [address] [phone]        Annia Belt, MD Follow up.   Specialty:  Oncology Why:  I left a voicemail for the clinic scheduler to make you an appointment. Hopefully, you will here from them in the next week about when your appointment will be. If you don't hear anything, you can call [phone] for an update Contact information: [place] [address] [phone]           Hospital Course by problem list: 62 year old female with h/o HLD, crushing injury to bilateral legs in 2011, and DVT in 2017 who presents with 5 days of dyspnea on exertion with diaphoresis. Hemodynamically stable and CTA with bilateral central pulmonary emboli without right heart strain.   Pulmonary Embolism, unprovoked: Remained hemodynamically stable throughout admission. Did not require oxygen. No right heart strain. No elevation of troponin or EKG changes.Treated with IV heparin and transitioned to po Xarelto. At this time, the PE appears unprovoked. No recent surgeries or travel. Her previous DVT in 2017 was treated with 3 months of Xarelto. Up to date on cancer screenings (breast, colon, cervical)although no reports/imaging are available in epic. Family history pertinent for dad with multiple pulmonary emboli. -Given that this her second blood clot she will need anticoagulation for life.  -Discharged on Xarelto 15mg  PO BID with meals x 21 days;  then 20mg  PO Qsupper - Scheduled outpatient with Dr. Beryle Beams for antiphospholipid syndrome workup.   HLD: - Continued home Lipitor 20mg  qd   Discharge Vitals:   BP 134/76 (BP Location: Left Arm)   Pulse 75   Temp 97.9 F (36.6 C) (Oral)   Resp 20   Ht 5\' 4"  (1.626 m)   Wt 222 lb 4.8 oz (100.8 kg)   SpO2 94%   BMI 38.16 kg/m   Pertinent Labs, Studies, and Procedures:  CTA Chest 1. Bilateral central pulmonary emboli, right greater than  left, without right heart strain by CT measurement of RV/LV ratio. 2. No pulmonary infarction. 3. No acute aortic syndrome.  VAS [place] Lower Extremity Venous (DVT):  Right lower extremity is negative for DVT. Left lower extremity is positive for DVT in the popliteal vein.   There is no evidence of Baker's cyst bilaterally.   Discharge Instructions:  Discharge Instructions    Call MD for:  difficulty breathing, headache or visual disturbances   Complete by:  As directed    Call MD for:  difficulty breathing, headache or visual disturbances   Complete by:  As directed    Call MD for:  severe uncontrolled pain   Complete by:  As directed    Call MD for:  severe uncontrolled pain   Complete by:  As directed    Diet - low sodium heart healthy   Complete by:  As directed    Diet - low sodium heart healthy   Complete by:  As directed    Increase activity slowly   Complete by:  As directed    Increase activity slowly   Complete by:  As directed     [name], Holmes were admitted to the hospital for a blood clot in your lungs. The ultrasound of your legs ended up showing a blood clot in your left leg. This doesn't change your treatment at all; it just gives [place] an answer as to where your lung clot came from! We treated you with intravenous heparin which served to break down the clots. It will be important for you to continue taking Xarelto for the indefinite future to prevent future blood clots. The main risk of this medicine is bleeding. Please call and schedule an appointment with your primary doctor, Dr. Philip Aspen. Dr. Beryle Beams "Dr. Darnell Level" would also like you to follow up in clinic with him so he can get some other lab tests to look for inherited clotting diseases. I have called his office and left a voicemail for them to schedule you an appointment. If you don't hear from them in a week, call [phone] and inquire about your appointment with Dr. Darnell Level.  It was a pleasure taking care of you!    Signed: Isabelle Course, MD 12/10/2017, 4:06 PM   Pager: [phone]

## 2017-12-10 NOTE — Progress Notes (Signed)
Discharge instructions have been provided to the patient and her family including new medications, follow up appointments and prescriptions. Patient and family verbalized understanding of discharge instructions.

## 2017-12-10 NOTE — Progress Notes (Signed)
Benefit check in progress for Claudia Holmes [phone]

## 2017-12-10 NOTE — Progress Notes (Signed)
   Subjective: Claudia Holmes continues to be in good spirits. Denies shortness of breath or chest pain with deep inspiration. Maintaining good oxygen saturations on room air. We discussed the unclear etiology of her PE and need for indefinite anticoagulation therapy. She is amenable to long term Xarelto and we have arranged for pharmacy to deliver free trial card and $10 copay card at time of discharge. We are getting an ultrasound of her legs to rule out DVT and then plan to discharge her this afternoon.   Objective:  Vital signs in last 24 hours: Vitals:   12/09/17 2043 12/09/17 2140 12/09/17 2316 12/10/17 0357  BP: 137/86 (!) 153/90 (!) 139/93 134/76  Pulse: 79 79 78 75  Resp: 17 (!) 24 20 20   Temp:  98.1 F (36.7 C) 98.2 F (36.8 C) 97.9 F (36.6 C)  TempSrc:  Oral Oral Oral  SpO2: 96% 97% 95% 94%  Weight:  223 lb 9.6 oz (101.4 kg)  222 lb 4.8 oz (100.8 kg)  Height:  5\' 4"  (1.626 m)     Gen: Well appearing, NAD ENT: OP clear without erythema or exudate.  Neck: No cervical LAD, No thyromegaly or nodules, No JVD. CV: RRR, no murmurs Pulm: Normal effort, CTA throughout, no wheezing Abd: Soft, NT, ND, normal BS.  Ext: Warm, no edema, normal joints Skin:  No rashes   Assessment/Plan:  Active Problems:   Pulmonary emboli (HCC)   Hyperlipidemia   Pulmonary embolism ([place])  62 year old female with h/o HLD, crushing injury to bilateral legs in 2011, and DVT in 2017 who presents with 5 days of dyspnea on exertion with diaphoresis. Hemodynamically stable and CTA with bilateral central pulmonary emboli without right heart strain.   Pulmonary Embolism, unprovoked: Remains hemodynamically stable. At this time, the PE appears unprovoked. No recent surgeries or travel. Her previous DVT was treated with 3 months of Xarelto. Up to date on cancer screenings (breast, colon, cervical) although no reports/imaging are available in epic. Dad with multiple pulmonary emboli.  - Given that this her  second blood clot she will need anticoagulation for life.  - D/c IV heparin and start Xarelto: Xarelto 15mg  PO BID with meals x 21 days; then 20mg  PO Qsupper - Schedule outpatient f/u and rec'd anticoagulation testing for antiphospholipid syndrome   HLD: - Continue home Lipitor 20mg  qd   Dispo: Anticipated discharge in approximately 1 day(s).   Isabelle Course, MD 12/10/2017, 7:20 AM Pager: [phone]

## 2017-12-10 NOTE — Progress Notes (Signed)
ANTICOAGULATION CONSULT NOTE   Pharmacy Consult for Heparin Indication: pulmonary embolus  No Known Allergies  Patient Measurements: Height: 5\' 4"  (162.6 cm) Weight: 223 lb 9.6 oz (101.4 kg) IBW/kg (Calculated) : 54.7 Heparin Dosing Weight: 77.3 kg  Vital Signs: Temp: 98.2 F (36.8 C) (08/05 2316) Temp Source: Oral (08/05 2316) BP: 139/93 (08/05 2316) Pulse Rate: 78 (08/05 2316)  Labs: Recent Labs    12/09/17 0938 12/09/17 2254  HGB 13.9  --   HCT 43.1  --   PLT 290  --   HEPARINUNFRC  --  0.65  CREATININE 1.00  --     Estimated Creatinine Clearance: 69.3 mL/min (by C-G formula based on SCr of 1 mg/dL).  Assessment: 62 y.o. female with PE for heparin  Goal of Therapy:  Heparin level 0.3-0.7 units/ml Monitor platelets by anticoagulation protocol: Yes   Plan:  Continue Heparin at current rate Follow-up am labs.  Phillis Knack, PharmD, BCPS  12/10/2017 12:14 AM

## 2017-12-10 NOTE — Progress Notes (Signed)
*  Preliminary Results*  Bilateral lower extremity venous duplex completed. Right lower extremity is negative for DVT. Left lower extremity is positive for DVT in the popliteal vein.   There is no evidence of Baker's cyst bilaterally.  12/10/2017 1:57 PM Amer Alcindor Dawna Part

## 2017-12-13 DIAGNOSIS — Z6838 Body mass index (BMI) 38.0-38.9, adult: Secondary | ICD-10-CM | POA: Diagnosis not present

## 2017-12-13 DIAGNOSIS — I10 Essential (primary) hypertension: Secondary | ICD-10-CM | POA: Diagnosis not present

## 2017-12-13 DIAGNOSIS — I2699 Other pulmonary embolism without acute cor pulmonale: Secondary | ICD-10-CM | POA: Diagnosis not present

## 2017-12-13 DIAGNOSIS — E038 Other specified hypothyroidism: Secondary | ICD-10-CM | POA: Diagnosis not present

## 2017-12-20 DIAGNOSIS — R0602 Shortness of breath: Secondary | ICD-10-CM | POA: Diagnosis not present

## 2017-12-20 DIAGNOSIS — I2699 Other pulmonary embolism without acute cor pulmonale: Secondary | ICD-10-CM | POA: Diagnosis not present

## 2017-12-20 DIAGNOSIS — R05 Cough: Secondary | ICD-10-CM | POA: Diagnosis not present

## 2017-12-20 DIAGNOSIS — J40 Bronchitis, not specified as acute or chronic: Secondary | ICD-10-CM | POA: Diagnosis not present

## 2017-12-27 DIAGNOSIS — I2699 Other pulmonary embolism without acute cor pulmonale: Secondary | ICD-10-CM | POA: Diagnosis not present

## 2017-12-27 DIAGNOSIS — E668 Other obesity: Secondary | ICD-10-CM | POA: Diagnosis not present

## 2017-12-27 DIAGNOSIS — R7309 Other abnormal glucose: Secondary | ICD-10-CM | POA: Diagnosis not present

## 2017-12-27 DIAGNOSIS — R0602 Shortness of breath: Secondary | ICD-10-CM | POA: Diagnosis not present

## 2017-12-30 ENCOUNTER — Encounter: Payer: Federal, State, Local not specified - PPO | Admitting: Oncology

## 2017-12-31 ENCOUNTER — Other Ambulatory Visit: Payer: Self-pay

## 2017-12-31 ENCOUNTER — Ambulatory Visit (INDEPENDENT_AMBULATORY_CARE_PROVIDER_SITE_OTHER): Payer: Federal, State, Local not specified - PPO | Admitting: Oncology

## 2017-12-31 ENCOUNTER — Encounter: Payer: Self-pay | Admitting: Oncology

## 2017-12-31 VITALS — BP 140/65 | HR 84 | Temp 97.8°F | Ht 63.0 in | Wt 226.9 lb

## 2017-12-31 DIAGNOSIS — I2699 Other pulmonary embolism without acute cor pulmonale: Secondary | ICD-10-CM | POA: Diagnosis not present

## 2017-12-31 DIAGNOSIS — Z823 Family history of stroke: Secondary | ICD-10-CM

## 2017-12-31 DIAGNOSIS — I82432 Acute embolism and thrombosis of left popliteal vein: Secondary | ICD-10-CM

## 2017-12-31 DIAGNOSIS — Z86718 Personal history of other venous thrombosis and embolism: Secondary | ICD-10-CM | POA: Diagnosis not present

## 2017-12-31 DIAGNOSIS — Z87828 Personal history of other (healed) physical injury and trauma: Secondary | ICD-10-CM

## 2017-12-31 DIAGNOSIS — Z7901 Long term (current) use of anticoagulants: Secondary | ICD-10-CM

## 2017-12-31 DIAGNOSIS — Z832 Family history of diseases of the blood and blood-forming organs and certain disorders involving the immune mechanism: Secondary | ICD-10-CM

## 2017-12-31 LAB — D-DIMER, QUANTITATIVE (NOT AT ARMC): D DIMER QUANT: 2.37 ug{FEU}/mL — AB (ref 0.00–0.50)
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
## 2017-12-31 NOTE — Patient Instructions (Signed)
To lab today Return visit as needed before April 2020

## 2018-01-02 ENCOUNTER — Telehealth: Payer: Self-pay | Admitting: *Deleted

## 2018-01-02 LAB — BETA-2-GLYCOPROTEIN I ABS, IGG/M/A: Beta-2 Glyco I IgG: <9 GPI IgG units (ref 0–20)

## 2018-01-02 LAB — CARDIOLIPIN ANTIBODIES, IGG, IGM, IGA: Anticardiolipin IgG: <9 GPL U/mL (ref 0–14)

## 2018-01-02 NOTE — Telephone Encounter (Signed)
Pt called / informed "lab shows no antibodies being made against her clotting factors; she still needs to stay on blood thinner" per Dr Beryle Beams. Stated  "this is good" and she will stay on her blood thinner.

## 2018-01-02 NOTE — Telephone Encounter (Signed)
-----   Message from Annia Belt, MD sent at 01/02/2018  9:48 AM EDT ----- Call pt: lab shows no antibodies being made against her clotting factors; she still needs to stay on blood thinner

## 2018-02-05 ENCOUNTER — Ambulatory Visit (INDEPENDENT_AMBULATORY_CARE_PROVIDER_SITE_OTHER): Payer: Self-pay | Admitting: Orthopaedic Surgery

## 2018-02-11 ENCOUNTER — Encounter (INDEPENDENT_AMBULATORY_CARE_PROVIDER_SITE_OTHER): Payer: Self-pay | Admitting: Orthopaedic Surgery

## 2018-02-11 ENCOUNTER — Ambulatory Visit (INDEPENDENT_AMBULATORY_CARE_PROVIDER_SITE_OTHER): Admitting: Orthopaedic Surgery

## 2018-02-11 VITALS — BP 133/85 | HR 66 | Ht 64.0 in | Wt 226.0 lb

## 2018-02-11 DIAGNOSIS — M25561 Pain in right knee: Secondary | ICD-10-CM | POA: Diagnosis not present

## 2018-02-11 DIAGNOSIS — M1711 Unilateral primary osteoarthritis, right knee: Secondary | ICD-10-CM
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
## 2018-05-06 ENCOUNTER — Other Ambulatory Visit: Payer: Self-pay | Admitting: Oncology

## 2018-05-06 DIAGNOSIS — I2782 Chronic pulmonary embolism: Secondary | ICD-10-CM

## 2018-05-06 DIAGNOSIS — Z7901 Long term (current) use of anticoagulants: Secondary | ICD-10-CM

## 2018-05-06 DIAGNOSIS — I825Y3 Chronic embolism and thrombosis of unspecified deep veins of proximal lower extremity, bilateral: Secondary | ICD-10-CM

## 2018-05-26 DIAGNOSIS — I10 Essential (primary) hypertension: Secondary | ICD-10-CM | POA: Diagnosis not present

## 2018-05-26 DIAGNOSIS — I2699 Other pulmonary embolism without acute cor pulmonale: Secondary | ICD-10-CM | POA: Diagnosis not present

## 2018-05-26 DIAGNOSIS — E7849 Other hyperlipidemia: Secondary | ICD-10-CM | POA: Diagnosis not present

## 2018-05-26 DIAGNOSIS — Z23 Encounter for immunization: Secondary | ICD-10-CM | POA: Diagnosis not present

## 2018-06-10 ENCOUNTER — Other Ambulatory Visit (HOSPITAL_COMMUNITY): Payer: Self-pay | Admitting: Internal Medicine

## 2018-06-10 ENCOUNTER — Ambulatory Visit (HOSPITAL_COMMUNITY)
Admission: RE | Admit: 2018-06-10 | Discharge: 2018-06-10 | Disposition: A | Discharge status: Home / Self Care | Payer: Federal, State, Local not specified - PPO | Source: Ambulatory Visit | Attending: Family | Admitting: Family

## 2018-06-10 DIAGNOSIS — M79604 Pain in right leg: Secondary | ICD-10-CM | POA: Diagnosis not present

## 2018-06-10 DIAGNOSIS — E039 Hypothyroidism, unspecified: Secondary | ICD-10-CM | POA: Diagnosis not present

## 2018-06-10 DIAGNOSIS — Z7901 Long term (current) use of anticoagulants: Secondary | ICD-10-CM | POA: Diagnosis not present

## 2018-06-10 DIAGNOSIS — I10 Essential (primary) hypertension: Secondary | ICD-10-CM | POA: Diagnosis not present

## 2018-06-10 DIAGNOSIS — I82509 Chronic embolism and thrombosis of unspecified deep veins of unspecified lower extremity: Secondary | ICD-10-CM | POA: Diagnosis not present

## 2018-06-10 DIAGNOSIS — M79609 Pain in unspecified limb: Secondary | ICD-10-CM | POA: Diagnosis not present

## 2018-06-10 DIAGNOSIS — R600 Localized edema: Secondary | ICD-10-CM | POA: Diagnosis not present

## 2018-06-10 NOTE — Progress Notes (Signed)
Right lower extremity venous duplex performed to rule out DVT. Results called and given to Collie Siad Drinkard at 11:25 am. Patient instructed to return to Dr. Shon Baton office.

## 2018-06-11 ENCOUNTER — Encounter: Payer: Self-pay | Admitting: Radiology

## 2018-06-11 ENCOUNTER — Other Ambulatory Visit: Payer: Self-pay | Admitting: Internal Medicine

## 2018-06-11 ENCOUNTER — Ambulatory Visit
Admission: RE | Admit: 2018-06-11 | Discharge: 2018-06-11 | Disposition: A | Discharge status: Home / Self Care | Payer: Federal, State, Local not specified - PPO | Source: Ambulatory Visit | Attending: Internal Medicine | Admitting: Internal Medicine

## 2018-06-11 DIAGNOSIS — K5730 Diverticulosis of large intestine without perforation or abscess without bleeding: Secondary | ICD-10-CM | POA: Diagnosis not present

## 2018-06-11 DIAGNOSIS — I82401 Acute embolism and thrombosis of unspecified deep veins of right lower extremity: Secondary | ICD-10-CM

## 2018-06-11 DIAGNOSIS — I82509 Chronic embolism and thrombosis of unspecified deep veins of unspecified lower extremity: Secondary | ICD-10-CM

## 2018-06-11 DIAGNOSIS — R600 Localized edema: Secondary | ICD-10-CM

## 2018-06-11 DIAGNOSIS — Z7901 Long term (current) use of anticoagulants: Secondary | ICD-10-CM

## 2018-06-11 DIAGNOSIS — O3480 Maternal care for other abnormalities of pelvic organs, unspecified trimester: Secondary | ICD-10-CM

## 2018-06-11 MED ORDER — IOPAMIDOL (ISOVUE-300) INJECTION 61%
100.0000 mL | Freq: Once | INTRAVENOUS | Status: AC | PRN
  Administered 2018-06-11: 100 mL via INTRAVENOUS

## 2018-06-12 DIAGNOSIS — Z7901 Long term (current) use of anticoagulants: Secondary | ICD-10-CM | POA: Diagnosis not present

## 2018-06-12 DIAGNOSIS — Z6839 Body mass index (BMI) 39.0-39.9, adult: Secondary | ICD-10-CM | POA: Diagnosis not present

## 2018-06-12 DIAGNOSIS — I10 Essential (primary) hypertension: Secondary | ICD-10-CM | POA: Diagnosis not present

## 2018-06-12 DIAGNOSIS — I82509 Chronic embolism and thrombosis of unspecified deep veins of unspecified lower extremity: Secondary | ICD-10-CM | POA: Diagnosis not present

## 2018-06-13 DIAGNOSIS — Z888 Allergy status to other drugs, medicaments and biological substances status: Secondary | ICD-10-CM | POA: Diagnosis not present

## 2018-06-13 DIAGNOSIS — L500 Allergic urticaria: Secondary | ICD-10-CM | POA: Diagnosis not present

## 2018-06-13 DIAGNOSIS — Z6839 Body mass index (BMI) 39.0-39.9, adult: Secondary | ICD-10-CM | POA: Diagnosis not present

## 2018-06-20 DIAGNOSIS — I2699 Other pulmonary embolism without acute cor pulmonale: Secondary | ICD-10-CM | POA: Diagnosis not present

## 2018-06-20 DIAGNOSIS — M25571 Pain in right ankle and joints of right foot: Secondary | ICD-10-CM | POA: Diagnosis not present

## 2018-06-20 DIAGNOSIS — I82509 Chronic embolism and thrombosis of unspecified deep veins of unspecified lower extremity: Secondary | ICD-10-CM | POA: Diagnosis not present

## 2018-06-20 DIAGNOSIS — M79671 Pain in right foot: Secondary | ICD-10-CM | POA: Diagnosis not present

## 2018-06-23 ENCOUNTER — Encounter (INDEPENDENT_AMBULATORY_CARE_PROVIDER_SITE_OTHER): Payer: Self-pay | Admitting: Orthopedic Surgery

## 2018-06-23 ENCOUNTER — Ambulatory Visit (INDEPENDENT_AMBULATORY_CARE_PROVIDER_SITE_OTHER): Admitting: Physician Assistant

## 2018-06-23 ENCOUNTER — Ambulatory Visit (INDEPENDENT_AMBULATORY_CARE_PROVIDER_SITE_OTHER): Payer: Federal, State, Local not specified - PPO

## 2018-06-23 VITALS — Ht 64.0 in | Wt 226.0 lb

## 2018-06-23 DIAGNOSIS — Z945 Skin transplant status: Secondary | ICD-10-CM

## 2018-06-23 DIAGNOSIS — M6701 Short Achilles tendon (acquired), right ankle: Secondary | ICD-10-CM | POA: Diagnosis not present

## 2018-06-23 DIAGNOSIS — M79671 Pain in right foot: Secondary | ICD-10-CM

## 2018-06-23 DIAGNOSIS — Z86718 Personal history of other venous thrombosis and embolism: Secondary | ICD-10-CM | POA: Diagnosis not present

## 2018-06-23 DIAGNOSIS — S8781XS Crushing injury of right lower leg, sequela: Secondary | ICD-10-CM | POA: Diagnosis not present
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
## 2018-06-27 ENCOUNTER — Telehealth (INDEPENDENT_AMBULATORY_CARE_PROVIDER_SITE_OTHER): Payer: Self-pay | Admitting: Physician Assistant

## 2018-06-27 ENCOUNTER — Other Ambulatory Visit (INDEPENDENT_AMBULATORY_CARE_PROVIDER_SITE_OTHER): Payer: Self-pay

## 2018-06-27 NOTE — Telephone Encounter (Signed)
Patient left a message requesting a note stating that she needs to wear the shoe that Dr. Sharol Given advised her to get that does not bend.  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  CB#[phone].  Thank you.

## 2018-06-27 NOTE — Telephone Encounter (Signed)
Called pt LM ON VM  to advise that note has been written and at the front desk for pick up whenever she has time. To call with questions.

## 2018-07-14 DIAGNOSIS — I10 Essential (primary) hypertension: Secondary | ICD-10-CM | POA: Diagnosis not present

## 2018-07-14 DIAGNOSIS — I82509 Chronic embolism and thrombosis of unspecified deep veins of unspecified lower extremity: Secondary | ICD-10-CM | POA: Diagnosis not present

## 2018-07-14 DIAGNOSIS — Z7901 Long term (current) use of anticoagulants: Secondary | ICD-10-CM | POA: Diagnosis not present

## 2018-07-14 DIAGNOSIS — R7309 Other abnormal glucose: Secondary | ICD-10-CM | POA: Diagnosis not present

## 2018-07-18 ENCOUNTER — Telehealth: Payer: Self-pay | Admitting: Hematology

## 2018-07-18 NOTE — Telephone Encounter (Signed)
lmom for pt to return call to office re new patient appt 08/08/18 at 12 pm. Transfer of care from Dr Waymon Budge. Mailed appt letter with location/date/time

## 2018-07-22 ENCOUNTER — Encounter: Payer: Self-pay | Admitting: *Deleted

## 2018-07-28 ENCOUNTER — Telehealth: Payer: Self-pay | Admitting: Hematology

## 2018-07-28 NOTE — Telephone Encounter (Signed)
Received call per pt 3/23 cxl new patient appt. Her PCP is now handling her dx

## 2018-07-29 NOTE — Addendum Note (Signed)
Addended by: Hulan Fray on: 07/29/2018 09:52 AM   Modules accepted: Orders

## 2018-08-08 ENCOUNTER — Ambulatory Visit: Payer: Federal, State, Local not specified - PPO | Admitting: Hematology

## 2018-08-08 ENCOUNTER — Inpatient Hospital Stay: Payer: Federal, State, Local not specified - PPO

## 2018-10-09 DIAGNOSIS — I10 Essential (primary) hypertension: Secondary | ICD-10-CM | POA: Diagnosis not present

## 2018-10-09 DIAGNOSIS — R739 Hyperglycemia, unspecified: Secondary | ICD-10-CM | POA: Diagnosis not present

## 2018-10-09 DIAGNOSIS — E7849 Other hyperlipidemia: Secondary | ICD-10-CM | POA: Diagnosis not present

## 2018-10-09 DIAGNOSIS — E038 Other specified hypothyroidism: Secondary | ICD-10-CM | POA: Diagnosis not present

## 2018-10-16 DIAGNOSIS — E785 Hyperlipidemia, unspecified: Secondary | ICD-10-CM | POA: Diagnosis not present

## 2018-10-16 DIAGNOSIS — I82509 Chronic embolism and thrombosis of unspecified deep veins of unspecified lower extremity: Secondary | ICD-10-CM | POA: Diagnosis not present

## 2018-10-16 DIAGNOSIS — R739 Hyperglycemia, unspecified: Secondary | ICD-10-CM | POA: Diagnosis not present

## 2018-10-16 DIAGNOSIS — I10 Essential (primary) hypertension: Secondary | ICD-10-CM | POA: Diagnosis not present

## 2018-10-16 DIAGNOSIS — Z0000 Encounter for general adult medical examination without abnormal findings: Secondary | ICD-10-CM | POA: Diagnosis not present

## 2018-12-22 DIAGNOSIS — Z1231 Encounter for screening mammogram for malignant neoplasm of breast: Secondary | ICD-10-CM | POA: Diagnosis not present

## 2019-02-05 DIAGNOSIS — Z23 Encounter for immunization: Secondary | ICD-10-CM | POA: Diagnosis not present

## 2019-02-11 ENCOUNTER — Ambulatory Visit: Payer: Self-pay | Admitting: Orthopaedic Surgery

## 2019-02-11 ENCOUNTER — Encounter: Payer: Self-pay | Admitting: Surgery

## 2019-02-11 ENCOUNTER — Ambulatory Visit (INDEPENDENT_AMBULATORY_CARE_PROVIDER_SITE_OTHER): Payer: Federal, State, Local not specified - PPO | Admitting: Surgery

## 2019-02-11 DIAGNOSIS — M1711 Unilateral primary osteoarthritis, right knee: Secondary | ICD-10-CM

## 2019-02-11 NOTE — Progress Notes (Signed)
Office Visit Note   Patient: Claudia Holmes           Date of Birth: 1955-08-24           MRN: [id number] Visit Date: 02/11/2019              Requested by: Leanna Battles, MD [place],  [place] [address] PCP: Leanna Battles, MD   Assessment & Plan: Visit Diagnoses:  1. Unilateral primary osteoarthritis, right knee     Plan: I will have patient follow-up in 3 months for recheck.  We did discuss that best treatment option at this point for the right knee would be total knee replacement.  She did see Dr. Sharol Given for some right foot and ankle issues and I think that altered gait pattern from chronic right knee problem is contributing to that.  If she would like to go ahead and proceed with scheduling before next appointment she will call and let me know.   Follow-Up Instructions: Return in about 3 months (around 05/14/2019) for recheck right knee.   Orders:  No orders of the defined types were placed in this encounter.  No orders of the defined types were placed in this encounter.     Procedures: No procedures performed   Clinical Data: No additional findings.   Subjective: Chief Complaint  Patient presents with  . Left Leg - Follow-up  . Right Leg - Follow-up    HPI 63 year old white female returns for recheck of right knee pain.  She has known history of end-stage DJD right knee.  She understands ultimately come down her needing total knee replacement.  States that she has been doing reasonably well.  Does continue to have some pain in the knee along with popping.  She was seen by Dr. Sharol Given for right foot and ankle issues and he recommended that she get a particular tennis shoe and he also gave her a heel wedge.  She states that this has helped some of her posterior heel pain.  She is also doing Achilles stretching exercises. Review of Systems No current cardiac pulmonary GI GU issues  Objective: Vital Signs: There were no vitals taken for this visit.   Physical Exam HENT:     Head: Normocephalic and atraumatic.  Pulmonary:     Effort: No respiratory distress.  Musculoskeletal:     Comments: Gait is minimally antalgic.  Right knee positive crepitus.  Medial lateral joint line tenderness.  Ligament stable.  Pretty good range of motion.  Neurological:     General: No focal deficit present.     Mental Status: She is alert and oriented to person, place, and time.     Ortho Exam  Specialty Comments:  No specialty comments available.  Imaging: No results found.   PMFS History: Patient Active Problem List   Diagnosis Date Noted  . Pulmonary emboli ([place]) 12/09/2017  . Hyperlipidemia 12/09/2017  . Pulmonary embolism ([place]) 12/09/2017   Past Medical History:  Diagnosis Date  . Anxiety   . High cholesterol     No family history on file.  Past Surgical History:  Procedure Laterality Date  . arm surgery    . LEG SURGERY     Social History   Occupational History  . Not on file  Tobacco Use  . Smoking status: Never Smoker  . Smokeless tobacco: Never Used  Substance and Sexual Activity  . Alcohol use: Never    Frequency: Never  . Drug use: Never  .  Sexual activity: Not on file

## 2019-04-16 DIAGNOSIS — I10 Essential (primary) hypertension: Secondary | ICD-10-CM | POA: Diagnosis not present

## 2019-04-16 DIAGNOSIS — R05 Cough: Secondary | ICD-10-CM | POA: Diagnosis not present

## 2019-04-16 DIAGNOSIS — I82509 Chronic embolism and thrombosis of unspecified deep veins of unspecified lower extremity: Secondary | ICD-10-CM | POA: Diagnosis not present

## 2019-05-14 ENCOUNTER — Ambulatory Visit: Payer: Self-pay

## 2019-05-14 ENCOUNTER — Other Ambulatory Visit: Payer: Self-pay

## 2019-05-14 ENCOUNTER — Ambulatory Visit: Payer: Federal, State, Local not specified - PPO | Admitting: Surgery

## 2019-05-14 ENCOUNTER — Encounter: Payer: Self-pay | Admitting: Surgery

## 2019-05-14 VITALS — BP 131/79 | HR 79

## 2019-05-14 DIAGNOSIS — G8929 Other chronic pain: Secondary | ICD-10-CM | POA: Diagnosis not present

## 2019-05-14 DIAGNOSIS — M25561 Pain in right knee: Secondary | ICD-10-CM

## 2019-05-14 DIAGNOSIS — M1711 Unilateral primary osteoarthritis, right knee: Secondary | ICD-10-CM | POA: Diagnosis not present

## 2019-05-14 NOTE — Progress Notes (Signed)
Office Visit Note   Patient: Claudia Holmes           Date of Birth: 1955/06/06           MRN: [id number] Visit Date: 05/14/2019              Requested by: Leanna Battles, MD [place],  [place] [id number] PCP: Leanna Battles, MD   Assessment & Plan: Visit Diagnoses:  1. Chronic pain of right knee   2. Unilateral primary osteoarthritis, right knee     Plan: Reviewed x-rays with patient today.  Again discussed with patient that best treatment option would be right total knee replacement.  I will have her follow-up with Dr. Lorin Mercy in 3 months for recheck to see what she is wanting to do at that time but if she is wanting to go ahead and schedule something in the near future she will call and let me know so I can get that process started.  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  We will need preop clearances from Dr. Sharlett Iles PCP and also her cardiologist Dr. Tollie Eth.  I will ask our surgery scheduler to go ahead and request those just in case patient is want to do surgery soon.  Follow-Up Instructions: Return in about 3 months (around 08/12/2019) for with dr yates.   Orders:  Orders Placed This Encounter  Procedures  . XR KNEE 3 VIEW RIGHT   No orders of the defined types were placed in this encounter.     Procedures: No procedures performed   Clinical Data: No additional findings.   Subjective: Chief Complaint  Patient presents with  . Right Knee - Follow-up    HPI 64 year old white female history of end-stage DJD right knee pain returns.  Patient last seen by me October 2020.  We discussed that best treatment option for her knee would be total knee replacement.  She continues have ongoing pain with ambulating.  No improvement since last visit.  She is trying to decide as to whether or not she would like to proceed with right total knee replacement as discussed last office visit.  Patient is currently on chronic Eliquis for  history of DVT/PE and this is monitored by primary care physician Dr. Bevelyn Buckles.  Patient's cardiologist is Dr. Tollie Eth but states that she has not seen him in a while. Review of Systems No current cardiac pulmonary GI GU issues  Objective: Vital Signs: BP 131/79   Pulse 79   Physical Exam HENT:     Head: Normocephalic and atraumatic.  Eyes:     Extraocular Movements: Extraocular movements intact.     Pupils: Pupils are equal, round, and reactive to light.  Pulmonary:     Effort: No respiratory distress.  Musculoskeletal:     Comments: Gait is somewhat antalgic.  Right knee patient has good range of motion.  Positive patellofemoral crepitus.  Joint line tender.  Some swelling without large effusion.  Calf nontender.  Neurological:     General: No focal deficit present.     Mental Status: She is alert and oriented to person, place, and time.  Psychiatric:        Mood and Affect: Mood normal.     Ortho Exam  Specialty Comments:  No specialty comments available.  Imaging: No results found.   PMFS History: Patient Active Problem List   Diagnosis Date Noted  . Pulmonary emboli ([place]) 12/09/2017  . Hyperlipidemia 12/09/2017  .  Pulmonary embolism ([place]) 12/09/2017   Past Medical History:  Diagnosis Date  . Anxiety   . High cholesterol     History reviewed. No pertinent family history.  Past Surgical History:  Procedure Laterality Date  . arm surgery    . LEG SURGERY     Social History   Occupational History  . Not on file  Tobacco Use  . Smoking status: Never Smoker  . Smokeless tobacco: Never Used  Substance and Sexual Activity  . Alcohol use: Never  . Drug use: Never  . Sexual activity: Not on file

## 2019-08-12 ENCOUNTER — Encounter: Payer: Self-pay | Admitting: Orthopaedic Surgery

## 2019-08-12 ENCOUNTER — Ambulatory Visit: Payer: Federal, State, Local not specified - PPO | Admitting: Orthopaedic Surgery

## 2019-08-12 ENCOUNTER — Other Ambulatory Visit: Payer: Self-pay

## 2019-08-12 DIAGNOSIS — M170 Bilateral primary osteoarthritis of knee: Secondary | ICD-10-CM | POA: Diagnosis not present
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
## 2019-08-13 ENCOUNTER — Encounter: Payer: Self-pay | Admitting: Orthopaedic Surgery

## 2019-08-13 DIAGNOSIS — M170 Bilateral primary osteoarthritis of knee: Secondary | ICD-10-CM | POA: Insufficient documentation

## 2019-10-20 DIAGNOSIS — Z0000 Encounter for general adult medical examination without abnormal findings: Secondary | ICD-10-CM | POA: Diagnosis not present

## 2019-10-20 DIAGNOSIS — E038 Other specified hypothyroidism: Secondary | ICD-10-CM | POA: Diagnosis not present

## 2019-10-20 DIAGNOSIS — E7849 Other hyperlipidemia: Secondary | ICD-10-CM | POA: Diagnosis not present

## 2019-10-20 DIAGNOSIS — R739 Hyperglycemia, unspecified: Secondary | ICD-10-CM | POA: Diagnosis not present

## 2019-10-20 DIAGNOSIS — I10 Essential (primary) hypertension: Secondary | ICD-10-CM | POA: Diagnosis not present

## 2019-10-27 DIAGNOSIS — Z1331 Encounter for screening for depression: Secondary | ICD-10-CM | POA: Diagnosis not present

## 2019-10-27 DIAGNOSIS — E669 Obesity, unspecified: Secondary | ICD-10-CM | POA: Diagnosis not present

## 2019-10-27 DIAGNOSIS — M79604 Pain in right leg: Secondary | ICD-10-CM | POA: Diagnosis not present

## 2019-10-27 DIAGNOSIS — I10 Essential (primary) hypertension: Secondary | ICD-10-CM | POA: Diagnosis not present

## 2019-10-27 DIAGNOSIS — Z86718 Personal history of other venous thrombosis and embolism: Secondary | ICD-10-CM | POA: Diagnosis not present

## 2019-10-27 DIAGNOSIS — Z7901 Long term (current) use of anticoagulants: Secondary | ICD-10-CM | POA: Diagnosis not present

## 2019-10-27 DIAGNOSIS — Z23 Encounter for immunization: Secondary | ICD-10-CM | POA: Diagnosis not present

## 2019-10-27 DIAGNOSIS — Z0000 Encounter for general adult medical examination without abnormal findings: Secondary | ICD-10-CM | POA: Diagnosis not present

## 2019-10-27 DIAGNOSIS — R82998 Other abnormal findings in urine: Secondary | ICD-10-CM | POA: Diagnosis not present

## 2019-10-28 DIAGNOSIS — M79604 Pain in right leg: Secondary | ICD-10-CM | POA: Diagnosis not present

## 2019-10-29 DIAGNOSIS — Z1212 Encounter for screening for malignant neoplasm of rectum: Secondary | ICD-10-CM | POA: Diagnosis not present

## 2019-11-11 ENCOUNTER — Ambulatory Visit: Payer: Federal, State, Local not specified - PPO | Admitting: Orthopaedic Surgery

## 2019-11-26 DIAGNOSIS — Z23 Encounter for immunization: Secondary | ICD-10-CM | POA: Diagnosis not present

## 2019-12-28 DIAGNOSIS — Z1231 Encounter for screening mammogram for malignant neoplasm of breast: Secondary | ICD-10-CM | POA: Diagnosis not present

## 2020-03-25 DIAGNOSIS — Z23 Encounter for immunization: Secondary | ICD-10-CM | POA: Diagnosis not present

## 2020-03-25 DIAGNOSIS — E039 Hypothyroidism, unspecified: Secondary | ICD-10-CM | POA: Diagnosis not present

## 2020-03-25 DIAGNOSIS — R739 Hyperglycemia, unspecified: Secondary | ICD-10-CM | POA: Diagnosis not present

## 2020-03-25 DIAGNOSIS — I10 Essential (primary) hypertension: Secondary | ICD-10-CM | POA: Diagnosis not present

## 2020-04-21 DIAGNOSIS — M5030 Other cervical disc degeneration, unspecified cervical region: Secondary | ICD-10-CM | POA: Diagnosis not present

## 2020-04-26 DIAGNOSIS — M5412 Radiculopathy, cervical region: Secondary | ICD-10-CM | POA: Diagnosis not present

## 2020-05-02 DIAGNOSIS — M542 Cervicalgia: Secondary | ICD-10-CM | POA: Diagnosis not present

## 2020-11-01 DIAGNOSIS — R739 Hyperglycemia, unspecified: Secondary | ICD-10-CM | POA: Diagnosis not present

## 2020-11-01 DIAGNOSIS — E785 Hyperlipidemia, unspecified: Secondary | ICD-10-CM | POA: Diagnosis not present

## 2020-11-01 DIAGNOSIS — E039 Hypothyroidism, unspecified: Secondary | ICD-10-CM | POA: Diagnosis not present

## 2020-11-08 DIAGNOSIS — Z0000 Encounter for general adult medical examination without abnormal findings: Secondary | ICD-10-CM | POA: Diagnosis not present

## 2020-11-08 DIAGNOSIS — I10 Essential (primary) hypertension: Secondary | ICD-10-CM | POA: Diagnosis not present

## 2020-11-08 DIAGNOSIS — R82998 Other abnormal findings in urine: Secondary | ICD-10-CM | POA: Diagnosis not present

## 2020-11-15 DIAGNOSIS — G4710 Hypersomnia, unspecified: Secondary | ICD-10-CM | POA: Diagnosis not present

## 2020-11-16 DIAGNOSIS — G4710 Hypersomnia, unspecified: Secondary | ICD-10-CM | POA: Diagnosis not present

## 2020-11-22 DIAGNOSIS — G4733 Obstructive sleep apnea (adult) (pediatric): Secondary | ICD-10-CM | POA: Diagnosis not present

## 2020-12-05 ENCOUNTER — Other Ambulatory Visit: Payer: Self-pay

## 2020-12-05 ENCOUNTER — Ambulatory Visit: Payer: Federal, State, Local not specified - PPO | Admitting: Cardiology

## 2020-12-05 ENCOUNTER — Encounter: Payer: Self-pay | Admitting: Cardiology

## 2020-12-05 VITALS — BP 134/73 | HR 74 | Temp 97.7°F | Ht 64.0 in | Wt 253.0 lb

## 2020-12-05 DIAGNOSIS — E782 Mixed hyperlipidemia: Secondary | ICD-10-CM

## 2020-12-05 DIAGNOSIS — Z86711 Personal history of pulmonary embolism: Secondary | ICD-10-CM | POA: Diagnosis not present

## 2020-12-05 DIAGNOSIS — R0609 Other forms of dyspnea: Secondary | ICD-10-CM

## 2020-12-05 DIAGNOSIS — R0600 Dyspnea, unspecified: Secondary | ICD-10-CM | POA: Insufficient documentation
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
## 2020-12-08 DIAGNOSIS — G4733 Obstructive sleep apnea (adult) (pediatric): Secondary | ICD-10-CM | POA: Diagnosis not present

## 2020-12-09 ENCOUNTER — Other Ambulatory Visit: Payer: Federal, State, Local not specified - PPO

## 2020-12-12 ENCOUNTER — Ambulatory Visit
Admission: RE | Admit: 2020-12-12 | Discharge: 2020-12-12 | Disposition: A | Discharge status: Home / Self Care | Payer: Federal, State, Local not specified - PPO | Source: Ambulatory Visit | Attending: Cardiology | Admitting: Cardiology

## 2020-12-12 DIAGNOSIS — R0609 Other forms of dyspnea: Secondary | ICD-10-CM | POA: Diagnosis not present

## 2020-12-12 DIAGNOSIS — J984 Other disorders of lung: Secondary | ICD-10-CM | POA: Diagnosis not present

## 2020-12-12 DIAGNOSIS — Z86711 Personal history of pulmonary embolism: Secondary | ICD-10-CM

## 2020-12-12 DIAGNOSIS — R0600 Dyspnea, unspecified: Secondary | ICD-10-CM

## 2020-12-12 MED ORDER — IOPAMIDOL (ISOVUE-370) INJECTION 76%
75.0000 mL | Freq: Once | INTRAVENOUS | Status: AC | PRN
  Administered 2020-12-12: 75 mL via INTRAVENOUS

## 2020-12-13 ENCOUNTER — Ambulatory Visit: Payer: Federal, State, Local not specified - PPO

## 2020-12-13 ENCOUNTER — Other Ambulatory Visit: Payer: Self-pay

## 2020-12-13 DIAGNOSIS — R0609 Other forms of dyspnea: Secondary | ICD-10-CM | POA: Diagnosis not present

## 2020-12-13 DIAGNOSIS — R0600 Dyspnea, unspecified: Secondary | ICD-10-CM

## 2020-12-19 ENCOUNTER — Other Ambulatory Visit: Payer: Self-pay

## 2020-12-19 ENCOUNTER — Ambulatory Visit: Payer: Federal, State, Local not specified - PPO

## 2020-12-19 DIAGNOSIS — R0600 Dyspnea, unspecified: Secondary | ICD-10-CM

## 2020-12-19 DIAGNOSIS — R0609 Other forms of dyspnea: Secondary | ICD-10-CM

## 2020-12-20 ENCOUNTER — Telehealth: Payer: Self-pay | Admitting: Gastroenterology

## 2020-12-20 NOTE — Telephone Encounter (Signed)
Hey Dr. Tarri Glenn,   We received a referral in our office for positive occult stool test. Patient requested you as her provider. Her last colonoscopy was done in 2015.   I have records and will send for review. Could you please review and advise on scheduling?  Thank you

## 2020-12-22 ENCOUNTER — Encounter: Payer: Self-pay | Admitting: Gastroenterology

## 2020-12-22 NOTE — Telephone Encounter (Signed)
Reviewed records from Dr. Bevelyn Buckles.  Recent evaluation underway for dyspnea on exertion.  Prior GI records reviewed.  Colonoscopy with Dr. Earlean Shawl 11/12/2013 showed a 9 mm flat cecal polyp.  Surveillance recommended in 5 years.

## 2020-12-22 NOTE — Telephone Encounter (Signed)
Thank you. Patient scheduled OV 9/27 with Dr. Tarri Glenn

## 2020-12-26 ENCOUNTER — Encounter: Payer: Self-pay | Admitting: Cardiology

## 2020-12-26 ENCOUNTER — Other Ambulatory Visit: Payer: Self-pay

## 2020-12-26 ENCOUNTER — Ambulatory Visit: Payer: Federal, State, Local not specified - PPO | Admitting: Cardiology

## 2020-12-26 VITALS — BP 140/75 | HR 70 | Temp 97.8°F | Ht 64.0 in | Wt 256.0 lb

## 2020-12-26 DIAGNOSIS — E782 Mixed hyperlipidemia: Secondary | ICD-10-CM | POA: Diagnosis not present

## 2020-12-26 DIAGNOSIS — R0609 Other forms of dyspnea: Secondary | ICD-10-CM

## 2020-12-26 DIAGNOSIS — R0600 Dyspnea, unspecified: Secondary | ICD-10-CM
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
## 2020-12-28 DIAGNOSIS — I10 Essential (primary) hypertension: Secondary | ICD-10-CM | POA: Diagnosis not present

## 2020-12-28 DIAGNOSIS — E1151 Type 2 diabetes mellitus with diabetic peripheral angiopathy without gangrene: Secondary | ICD-10-CM | POA: Diagnosis not present

## 2021-01-02 DIAGNOSIS — Z1231 Encounter for screening mammogram for malignant neoplasm of breast: Secondary | ICD-10-CM | POA: Diagnosis not present

## 2021-01-08 DIAGNOSIS — G4733 Obstructive sleep apnea (adult) (pediatric): Secondary | ICD-10-CM | POA: Diagnosis not present

## 2021-01-31 ENCOUNTER — Encounter: Payer: Self-pay | Admitting: Gastroenterology

## 2021-01-31 ENCOUNTER — Ambulatory Visit (INDEPENDENT_AMBULATORY_CARE_PROVIDER_SITE_OTHER): Payer: Federal, State, Local not specified - PPO | Admitting: Gastroenterology

## 2021-01-31 DIAGNOSIS — R195 Other fecal abnormalities: Secondary | ICD-10-CM

## 2021-01-31 DIAGNOSIS — Z86010 Personal history of colonic polyps: Secondary | ICD-10-CM

## 2021-01-31 NOTE — Patient Instructions (Signed)
It was my pleasure to provide care to you today. Based on our discussion, I am providing you with my recommendations below:  RECOMMENDATION(S):   COLONOSCOPY:   You have been scheduled for a colonoscopy. Please follow written instructions given to you at your visit today.   PREP:   Please pick up your prep supplies at the pharmacy within the next 1-3 days.  INHALERS:   If you use inhalers (even only as needed), please bring them with you on the day of your procedure.  MEDICATIONS TO HOLD:  We will contact your provider to request permission for you to hold Eliquis. Once we receive a response, you will be contacted by our office. If you do not hear from our office 1 week prior to your scheduled procedure, please contact our office at [phone].   COLONOSCOPY TIPS:  To reduce nausea and dehydration, stay well hydrated for 3-4 days prior to the exam.  To prevent skin/hemorrhoid irritation - prior to wiping, put A&Dointment or vaseline on the toilet paper. Keep a towel or pad on the bed.  BEFORE STARTING YOUR PREP, drink  64oz of clear liquids in the morning. This will help to flush the colon and will ensure you are well hydrated!!!!  NOTE - This is in addition to the fluids required for to complete your prep. Use of a flavored hard candy, such as grape Anise Salvo, can counteract some of the flavor of the prep and may prevent some nausea.     FOLLOW UP:  After your procedure, you will receive a call from my office staff regarding my recommendation for follow up.  BMI:  If you are age 7 or older, your body mass index should be between 23-30. Your Body mass index is 42.74 kg/m. If this is out of the aforementioned range listed, please consider follow up with your Primary Care Provider.  If you are age 4 or younger, your body mass index should be between 19-25. Your Body mass index is 42.74 kg/m. If this is out of the aformentioned range listed, please consider follow up  with your Primary Care Provider.   MY CHART:  The [facility] providers would like to encourage you to use [facility] to communicate with providers for non-urgent requests or questions.  Due to long hold times on the telephone, sending your provider a message by [facility] may be a faster and more efficient way to get a response.  Please allow 48 business hours for a response.  Please remember that this is for non-urgent requests.   Thank you for trusting me with your gastrointestinal care!    Thornton Park, MD, MPH
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
## 2021-02-07 DIAGNOSIS — G4733 Obstructive sleep apnea (adult) (pediatric): Secondary | ICD-10-CM | POA: Diagnosis not present

## 2021-02-08 DIAGNOSIS — G4733 Obstructive sleep apnea (adult) (pediatric): Secondary | ICD-10-CM | POA: Diagnosis not present

## 2021-02-10 DIAGNOSIS — Z23 Encounter for immunization: Secondary | ICD-10-CM | POA: Diagnosis not present

## 2021-02-10 DIAGNOSIS — E1151 Type 2 diabetes mellitus with diabetic peripheral angiopathy without gangrene: Secondary | ICD-10-CM | POA: Diagnosis not present

## 2021-02-10 DIAGNOSIS — I10 Essential (primary) hypertension: Secondary | ICD-10-CM | POA: Diagnosis not present

## 2021-02-15 ENCOUNTER — Telehealth: Payer: Self-pay | Admitting: Gastroenterology

## 2021-02-15 NOTE — Telephone Encounter (Signed)
Pt is scheduled for colon next Monday. She wants to know when she should hold off Eliquis. Pls call her.

## 2021-02-15 NOTE — Telephone Encounter (Signed)
Left voiemail on Claudia Holmes's phone at [phone].  Advised that clearance letter was faxed on 01-31-21 and 02-08-21, but there had been no response received.  Per voicemail, Claudia Holmes and Dr Philip Aspen are out of the office on Wednesdays.  Will continue efforts.   Spoke with patient to make her aware of multiple attempts to receive information from Dr Shon Baton office with no response.  Advised patient if she has not heard from our office by Friday am, to give our office a call.  Patient agree to plan and verbalized understanding.

## 2021-02-15 NOTE — Telephone Encounter (Signed)
This appears to be something you were working on

## 2021-02-17 NOTE — Telephone Encounter (Signed)
Left message on Claudia Holmes's voicemail to please fax over clearance or give our office a call in regards to Eliquis hold prior to procedure scheduled for 02-20-21.

## 2021-02-17 NOTE — Telephone Encounter (Signed)
Claudia Holmes returned call stating that Dr Philip Aspen has been out of the office and stated she is sorry for the delay.  Michaela gave verbal clearance that patient can hold Eliquis 2 days prior to colonoscopy scheduled for 02-20-21.

## 2021-02-17 NOTE — Telephone Encounter (Signed)
Pls call pt, she needs to know about holding BT. Her procedure is next Monday.

## 2021-02-17 NOTE — Telephone Encounter (Signed)
Patient advised that she has been given clearance to hold Eliquis 2 days prior to colonoscopy scheduled for 02-20-21.  Patient advised to take last dose of Eliquis on 02-17-21, and she will be advised when to restart Eliquis by Dr Tarri Glenn after the procedure.  Patient agreed to plan and verbalized understanding.  No further questions.

## 2021-02-19 ENCOUNTER — Encounter: Payer: Self-pay | Admitting: Certified Registered Nurse Anesthetist

## 2021-02-20 ENCOUNTER — Ambulatory Visit: Payer: Federal, State, Local not specified - PPO | Admitting: Gastroenterology

## 2021-02-20 ENCOUNTER — Encounter: Payer: Self-pay | Admitting: Gastroenterology

## 2021-02-20 VITALS — BP 142/80 | HR 64 | Temp 97.1°F | Resp 16 | Ht 64.0 in | Wt 249.0 lb

## 2021-02-20 DIAGNOSIS — D120 Benign neoplasm of cecum: Secondary | ICD-10-CM

## 2021-02-20 DIAGNOSIS — D122 Benign neoplasm of ascending colon: Secondary | ICD-10-CM

## 2021-02-20 DIAGNOSIS — R195 Other fecal abnormalities: Secondary | ICD-10-CM | POA: Diagnosis not present

## 2021-02-20 DIAGNOSIS — K635 Polyp of colon: Secondary | ICD-10-CM | POA: Diagnosis not present

## 2021-02-20 NOTE — Progress Notes (Signed)
Called to room to assist during endoscopic procedure.  Patient ID and intended procedure confirmed with present staff. Received instructions for my participation in the procedure from the performing physician.  

## 2021-02-20 NOTE — Op Note (Signed)
[facility] Patient Name: Claudia Holmes Procedure Date: 02/20/2021 1:48 PM MRN: [id number] Endoscopist: Thornton Park MD, MD Age: 65 Referring MD:  Date of Birth: 1955-06-12 Gender: Female Account #: [id number] Procedure:                Colonoscopy Indications:              Hemoccult positivewithout anemia or overt bleeding                           History of colon polyps with Dr. Allyn Kenner in 2009                            and 2015 Medicines:                Monitored Anesthesia Care Procedure:                Pre-Anesthesia Assessment:                           - Prior to the procedure, a History and Physical                            was performed, and patient medications and                            allergies were reviewed. The patient's tolerance of                            previous anesthesia was also reviewed. The risks                            and benefits of the procedure and the sedation                            options and risks were discussed with the patient.                            All questions were answered, and informed consent                            was obtained. Prior Anticoagulants: The patient has                            taken Eliquis (apixaban), last dose was 3 days                            prior to procedure. ASA Grade Assessment: III - A                            patient with severe systemic disease. After                            reviewing the risks and benefits, the patient was  deemed in satisfactory condition to undergo the                            procedure.                           After obtaining informed consent, the colonoscope                            was passed under direct vision. Throughout the                            procedure, the patient's blood pressure, pulse, and                            oxygen saturations were monitored continuously. The                             Olympus CF-HQ190L [id number]) Colonoscope was                            introduced through the anus and advanced to the the                            cecum, identified by appendiceal orifice and                            ileocecal valve. A second forward view of the right                            colon was performed. The colonoscopy was performed                            without difficulty. The patient tolerated the                            procedure well. The quality of the bowel                            preparation was good. The terminal ileum, ileocecal                            valve, appendiceal orifice, and rectum were                            photographed. Scope In: 1:57:53 PM Scope Out: 2:13:37 PM Scope Withdrawal Time: 0 hours 12 minutes 48 seconds  Total Procedure Duration: 0 hours 15 minutes 44 seconds  Findings:                 The perianal and digital rectal examinations were                            normal.  Multiple small and large-mouthed diverticula were                            found in the sigmoid colon and descending colon.                           A 1 mm polyp was found in the proximal ascending                            colon. The polyp was sessile. The polyp was removed                            with a cold snare. Resection and retrieval were                            complete. Estimated blood loss was minimal.                           A 1 mm polyp was found in the cecum. The polyp was                            sessile. The polyp was removed with a cold snare.                            Resection and retrieval were complete. Estimated                            blood loss was minimal.                           The exam was otherwise without abnormality on                            direct and retroflexion views. Complications:            No immediate complications. Estimated blood loss:                             Minimal. Estimated Blood Loss:     Estimated blood loss was minimal. Impression:               - Diverticulosis in the sigmoid colon and in the                            descending colon.                           - One 1 mm polyp in the proximal ascending colon,                            removed with a cold snare. Resected and retrieved.                           - One 1 mm polyp in the cecum,  removed with a cold                            snare. Resected and retrieved.                           - The examination was otherwise normal on direct                            and retroflexion views.                           - The small polyps may be the source of hemoccult                            positive stools. No further evaluation recommended                            unless there is progressive anemia or overt                            bleeding. Recommendation:           - Patient has a contact number available for                            emergencies. The signs and symptoms of potential                            delayed complications were discussed with the                            patient. Return to normal activities tomorrow.                            Written discharge instructions were provided to the                            patient.                           - Continue present medications.                           - Resume Eliquis tomorrow.                           - Await pathology results.                           - Repeat colonoscopy date to be determined after                            pending pathology results are reviewed for                            surveillance.                           -  Follow a high fiber diet. Drink at least 64                            ounces of water daily. Add a daily stool bulking                            agent such as psyllium (an exampled would be                            Metamucil).                           -  Emerging evidence supports eating a diet of                            fruits, vegetables, grains, calcium, and yogurt                            while reducing red meat and alcohol may reduce the                            risk of colon cancer.                           - Thank you for allowing me to be involved in your                            colon cancer prevention. Thornton Park MD, MD 02/20/2021 2:18:35 PM This report has been signed electronically.

## 2021-02-20 NOTE — Patient Instructions (Signed)
Thank you for allowing [place] to care for you today! Await final results of polyps removed, approximately 7-10 days by mail.  Will make recommendation at that time for a future colonoscopy. Resume previous diet and medications today.  Resume Eliquis tomorrow, 02/21/2021 ( Tuesday) Recommend a high fiber diet and consuming at least 64 ounces of water daily.   Recommend also adding a stool bulking agent such as Metamucil or Benefiber ( or psyllium product) to minimize risk of constipation. Return to normal daily activities.       YOU HAD AN ENDOSCOPIC PROCEDURE TODAY AT [facility]:   Refer to the procedure report that was given to you for any specific questions about what was found during the examination.  If the procedure report does not answer your questions, please call your gastroenterologist to clarify.  If you requested that your care partner not be given the details of your procedure findings, then the procedure report has been included in a sealed envelope for you to review at your convenience later.  YOU SHOULD EXPECT: Some feelings of bloating in the abdomen. Passage of more gas than usual.  Walking can help get rid of the air that was put into your GI tract during the procedure and reduce the bloating. If you had a lower endoscopy (such as a colonoscopy or flexible sigmoidoscopy) you may notice spotting of blood in your stool or on the toilet paper. If you underwent a bowel prep for your procedure, you may not have a normal bowel movement for a few days.  Please Note:  You might notice some irritation and congestion in your nose or some drainage.  This is from the oxygen used during your procedure.  There is no need for concern and it should clear up in a day or so.  SYMPTOMS TO REPORT IMMEDIATELY:  Following lower endoscopy (colonoscopy or flexible sigmoidoscopy):  Excessive amounts of blood in the stool  Significant tenderness or worsening of abdominal pains  Swelling  of the abdomen that is new, acute  Fever of 100F or higher    For urgent or emergent issues, a gastroenterologist can be reached at any hour by calling [phone]. Do not use MyChart messaging for urgent concerns.    DIET:  We do recommend a small meal at first, but then you may proceed to your regular diet.  Drink plenty of fluids but you should avoid alcoholic beverages for 24 hours.  ACTIVITY:  You should plan to take it easy for the rest of today and you should NOT DRIVE or use heavy machinery until tomorrow (because of the sedation medicines used during the test).    FOLLOW UP: Our staff will call the number listed on your records 48-72 hours following your procedure to check on you and address any questions or concerns that you may have regarding the information given to you following your procedure. If we do not reach you, we will leave a message.  We will attempt to reach you two times.  During this call, we will ask if you have developed any symptoms of COVID 19. If you develop any symptoms (ie: fever, flu-like symptoms, shortness of breath, cough etc.) before then, please call [phone].  If you test positive for Covid 19 in the 2 weeks post procedure, please call and report this information to [place].    If any biopsies were taken you will be contacted by phone or by letter within the next 1-3 weeks.  Please call  us at [phone] if you have not heard about the biopsies in 3 weeks.    SIGNATURES/CONFIDENTIALITY: You and/or your care partner have signed paperwork which will be entered into your electronic medical record.  These signatures attest to the fact that that the information above on your After Visit Summary has been reviewed and is understood.  Full responsibility of the confidentiality of this discharge information lies with you and/or your care-partner.

## 2021-02-20 NOTE — Progress Notes (Signed)
VS by CW  I have reviewed the patient's medical history in detail and updated the computerized patient record.  

## 2021-02-20 NOTE — Progress Notes (Signed)
   Referring Provider: Leanna Battles, MD Primary Care Physician:  Leanna Battles, MD  Reason for Procedure:  Hemoccult positive stools   IMPRESSION:  Hemoccult positive without anemia or overt bleeding History of colon polyps with Dr. Allyn Kenner in 2009 and 2015 Chronic Eliquis use Appropriate candidate for monitored anesthesia care  PLAN: Colonoscopy in the [facility] today   HPI: Claudia Holmes is a 65 y.o. female presents for colonoscopy to evaluate hemoccult positive stools without anemia or overt bleeding. Please see my office note from 01/31/21 for full details.  There has been no change in history or physical exam.   Prior GI records reviewed. Colonoscopy in 2009 showed adenomatous polyp.  Colonoscopy with Dr. Earlean Shawl 11/12/2013 showed a 9 mm flat cecal polyp.  Surveillance recommended in 5 years.     Past Medical History:  Diagnosis Date   Anxiety    High cholesterol     Past Surgical History:  Procedure Laterality Date   arm surgery     LEG SURGERY      Current Outpatient Medications  Medication Sig Dispense Refill   acetaminophen (TYLENOL) 500 MG tablet Take 500 mg by mouth every 6 (six) hours as needed.     atorvastatin (LIPITOR) 20 MG tablet Take 20 mg by mouth daily.     ELIQUIS 2.5 MG TABS tablet Take 2.5 mg by mouth 2 (two) times daily.     ezetimibe (ZETIA) 10 MG tablet Take 10 mg by mouth daily.     tirzepatide [facility]) 5 MG/0.5ML Pen inject 5mg      No current facility-administered medications for this visit.    Allergies as of 02/20/2021   (No Known Allergies)    Family History  Problem Relation Age of Onset   Stroke Mother    Heart attack Father    Stroke Maternal Grandmother    Heart disease Paternal Grandmother      Physical Exam: General:   Alert,  well-nourished, pleasant and cooperative in NAD Head:  Normocephalic and atraumatic. Eyes:  Sclera clear, no icterus.   Conjunctiva pink. Mouth:  No deformity or lesions.   Neck:  Supple; no  masses or thyromegaly. Lungs:  Clear throughout to auscultation.   No wheezes. Heart:  Regular rate and rhythm; no murmurs. Abdomen:  Soft, non-tender, nondistended, normal bowel sounds, no rebound or guarding.  Msk:  Symmetrical. No boney deformities LAD: No inguinal or umbilical LAD Extremities:  No clubbing or edema. Neurologic:  Alert and  oriented x4;  grossly nonfocal Skin:  No obvious rash or bruise. Psych:  Alert and cooperative. Normal mood and affect.      Grier Vu L. Tarri Glenn, MD, MPH 02/20/2021, 12:25 PM

## 2021-02-20 NOTE — Progress Notes (Signed)
Report given to PACU, vss 

## 2021-02-22 ENCOUNTER — Telehealth: Payer: Self-pay

## 2021-02-22 NOTE — Telephone Encounter (Signed)
  Follow up Call-  Call back number 02/20/2021  Post procedure Call Back phone  # [phone]  Permission to leave phone message Yes  Some recent data might be hidden     Patient questions:  Do you have a fever, pain , or abdominal swelling? No. Pain Score  0 *  Have you tolerated food without any problems? Yes.    Have you been able to return to your normal activities? Yes.    Do you have any questions about your discharge instructions: Diet   No. Medications  No. Follow up visit  No.  Do you have questions or concerns about your Care? No.  Actions: * If pain score is 4 or above: No action needed, pain <4.

## 2021-02-26 ENCOUNTER — Encounter: Payer: Self-pay | Admitting: Gastroenterology

## 2021-03-10 DIAGNOSIS — G4733 Obstructive sleep apnea (adult) (pediatric): Secondary | ICD-10-CM | POA: Diagnosis not present

## 2021-03-15 DIAGNOSIS — G4733 Obstructive sleep apnea (adult) (pediatric): Secondary | ICD-10-CM | POA: Diagnosis not present

## 2021-03-29 DIAGNOSIS — E1151 Type 2 diabetes mellitus with diabetic peripheral angiopathy without gangrene: Secondary | ICD-10-CM | POA: Diagnosis not present

## 2021-03-29 DIAGNOSIS — I10 Essential (primary) hypertension: Secondary | ICD-10-CM | POA: Diagnosis not present

## 2021-04-09 DIAGNOSIS — G4733 Obstructive sleep apnea (adult) (pediatric): Secondary | ICD-10-CM | POA: Diagnosis not present

## 2021-04-14 DIAGNOSIS — G4733 Obstructive sleep apnea (adult) (pediatric): Secondary | ICD-10-CM | POA: Diagnosis not present

## 2021-05-10 DIAGNOSIS — G4733 Obstructive sleep apnea (adult) (pediatric): Secondary | ICD-10-CM | POA: Diagnosis not present

## 2021-05-15 DIAGNOSIS — G4733 Obstructive sleep apnea (adult) (pediatric): Secondary | ICD-10-CM | POA: Diagnosis not present

## 2021-06-10 DIAGNOSIS — G4733 Obstructive sleep apnea (adult) (pediatric): Secondary | ICD-10-CM | POA: Diagnosis not present

## 2021-06-15 DIAGNOSIS — E1151 Type 2 diabetes mellitus with diabetic peripheral angiopathy without gangrene: Secondary | ICD-10-CM | POA: Diagnosis not present

## 2021-06-15 DIAGNOSIS — I10 Essential (primary) hypertension: Secondary | ICD-10-CM | POA: Diagnosis not present

## 2021-07-08 DIAGNOSIS — G4733 Obstructive sleep apnea (adult) (pediatric): Secondary | ICD-10-CM | POA: Diagnosis not present

## 2021-08-08 DIAGNOSIS — G4733 Obstructive sleep apnea (adult) (pediatric): Secondary | ICD-10-CM | POA: Diagnosis not present

## 2021-08-28 DIAGNOSIS — E1151 Type 2 diabetes mellitus with diabetic peripheral angiopathy without gangrene: Secondary | ICD-10-CM | POA: Diagnosis not present

## 2021-08-30 DIAGNOSIS — G4733 Obstructive sleep apnea (adult) (pediatric): Secondary | ICD-10-CM | POA: Diagnosis not present

## 2021-09-12 DIAGNOSIS — G4733 Obstructive sleep apnea (adult) (pediatric): Secondary | ICD-10-CM | POA: Diagnosis not present

## 2021-10-13 DIAGNOSIS — G4733 Obstructive sleep apnea (adult) (pediatric): Secondary | ICD-10-CM | POA: Diagnosis not present

## 2021-11-12 DIAGNOSIS — G4733 Obstructive sleep apnea (adult) (pediatric): Secondary | ICD-10-CM | POA: Diagnosis not present

## 2022-02-14 ENCOUNTER — Other Ambulatory Visit (HOSPITAL_COMMUNITY): Payer: Self-pay

## 2022-02-15 ENCOUNTER — Other Ambulatory Visit (HOSPITAL_COMMUNITY): Payer: Self-pay

## 2022-02-15 MED ORDER — OZEMPIC (2 MG/DOSE) 8 MG/3ML ~~LOC~~ SOPN
2.0000 mg | PEN_INJECTOR | SUBCUTANEOUS | 11 refills | Status: DC
  Filled 2022-02-15: qty 3, 28d supply, fill #0
  Filled 2022-03-07 – 2022-03-09 (×2): qty 3, 28d supply, fill #1
  Filled 2022-03-09: qty 3, 28d supply, fill #0
  Filled 2022-04-05: qty 3, 28d supply, fill #1

## 2022-03-07 ENCOUNTER — Other Ambulatory Visit (HOSPITAL_COMMUNITY): Payer: Self-pay

## 2022-03-09 ENCOUNTER — Other Ambulatory Visit (HOSPITAL_COMMUNITY): Payer: Self-pay

## 2022-04-05 ENCOUNTER — Other Ambulatory Visit (HOSPITAL_COMMUNITY): Payer: Self-pay

## 2022-05-17 ENCOUNTER — Emergency Department (HOSPITAL_BASED_OUTPATIENT_CLINIC_OR_DEPARTMENT_OTHER): Payer: Medicare Other | Admitting: Radiology

## 2022-05-17 ENCOUNTER — Encounter (HOSPITAL_BASED_OUTPATIENT_CLINIC_OR_DEPARTMENT_OTHER): Payer: Self-pay

## 2022-05-17 DIAGNOSIS — M545 Low back pain, unspecified: Secondary | ICD-10-CM | POA: Insufficient documentation

## 2022-05-17 DIAGNOSIS — Z7901 Long term (current) use of anticoagulants: Secondary | ICD-10-CM | POA: Insufficient documentation

## 2022-05-17 DIAGNOSIS — X501XXA Overexertion from prolonged static or awkward postures, initial encounter: Secondary | ICD-10-CM | POA: Insufficient documentation

## 2022-05-17 DIAGNOSIS — Z794 Long term (current) use of insulin: Secondary | ICD-10-CM | POA: Insufficient documentation

## 2022-05-17 NOTE — ED Triage Notes (Signed)
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

## 2022-05-18 ENCOUNTER — Emergency Department (HOSPITAL_BASED_OUTPATIENT_CLINIC_OR_DEPARTMENT_OTHER)
Admission: EM | Admit: 2022-05-18 | Discharge: 2022-05-18 | Disposition: A | Discharge status: Home / Self Care | Payer: Medicare Other | Attending: Emergency Medicine | Admitting: Emergency Medicine

## 2022-05-18 DIAGNOSIS — M5416 Radiculopathy, lumbar region: Secondary | ICD-10-CM

## 2022-05-18 DIAGNOSIS — M545 Low back pain, unspecified: Secondary | ICD-10-CM | POA: Diagnosis not present

## 2022-05-18 MED ORDER — HYDROCODONE-ACETAMINOPHEN 5-325 MG PO TABS: 1.0000 | ORAL_TABLET | Freq: Four times a day (QID) | ORAL | 0 refills | Status: DC | PRN

## 2022-05-18 MED ORDER — PREDNISONE 20 MG PO TABS
40.0000 mg | ORAL_TABLET | Freq: Once | ORAL | Status: AC
  Administered 2022-05-18: 40 mg via ORAL
  Filled 2022-05-18: qty 2

## 2022-05-18 MED ORDER — HYDROCODONE-ACETAMINOPHEN 5-325 MG PO TABS
2.0000 | ORAL_TABLET | Freq: Once | ORAL | Status: AC
  Administered 2022-05-18: 2 via ORAL
  Filled 2022-05-18: qty 2

## 2022-05-18 MED ORDER — PREDNISONE 10 MG PO TABS: 20.0000 mg | ORAL_TABLET | Freq: Two times a day (BID) | ORAL | 0 refills | Status: DC

## 2022-05-18 NOTE — Discharge Instructions (Addendum)
Begin taking prednisone as prescribed.  Begin taking hydrocodone as prescribed as needed for pain.  Rest.  Follow-up with your primary doctor if symptoms or not improving in the next week.

## 2022-05-18 NOTE — ED Provider Notes (Signed)
[facility] Provider Note   CSN: [id number] Arrival date & time: 05/17/22  2039     History  Chief Complaint  Patient presents with   Back Pain   Leg Pain    bilateral   Sciatica    Claudia Holmes is a 68 y.o. female.  Patient is a 67 year old female with history of prior PE, hyperlipidemia, osteoarthritis of the knee.  Patient presenting today with complaints of low back pain.  This has been bothering her for the past 2 days.  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  She describes a stabbing pain to her low back that radiates down her leg.  She denies any weakness or numbness.  She denies any bowel or bladder complaints.  Pain is worse when she ambulates and moves.  She has tried taking Tylenol with little relief.  The history is provided by the patient.       Home Medications Prior to Admission medications   Medication Sig Start Date End Date Taking? Authorizing Provider  acetaminophen (TYLENOL) 500 MG tablet Take 500 mg by mouth every 6 (six) hours as needed.    [provider]  atorvastatin (LIPITOR) 20 MG tablet Take 20 mg by mouth daily.    [provider]  ELIQUIS 2.5 MG TABS tablet Take 2.5 mg by mouth 2 (two) times daily. 04/18/19   [provider]  ezetimibe (ZETIA) 10 MG tablet Take 10 mg by mouth daily. 03/10/19   [provider]  Semaglutide, 2 MG/DOSE, (OZEMPIC, 2 MG/DOSE,) 8 MG/3ML SOPN Inject 2 mg into the skin once a week. 02/15/22     tirzepatide Darcel Bayley) 5 MG/0.5ML Pen inject '5mg'$  12/28/20   [provider]      Allergies    Patient has no known allergies.    Review of Systems   Review of Systems  All other systems reviewed and are negative.   Physical Exam Updated Vital Signs BP (!) 164/70 (BP Location: Right Arm)   Pulse 92    Temp 99.1 F (37.3 C)   Resp 19   SpO2 94%  Physical Exam Vitals and nursing note reviewed.  Constitutional:      General: She is not in acute distress.    Appearance: Normal appearance. She is not ill-appearing.  HENT:     Head: Normocephalic and atraumatic.  Pulmonary:     Effort: Pulmonary effort is normal.  Musculoskeletal:     Comments: There is tenderness to palpation in the soft tissues of the right lower lumbar region.  Skin:    General: Skin is warm and dry.  Neurological:     General: No focal deficit present.     Mental Status: She is alert and oriented to person, place, and time.     Comments: DTRs are 1+ and symmetrical in the patellar and Achilles tendons bilaterally.  Strength is 5 out of 5 in both lower extremities.  She is able to ambulate on her heels and toes.     ED Results / Procedures / Treatments   Labs (all labs ordered are listed, but only abnormal results are displayed) Labs Reviewed - No data to display  EKG None  Radiology DG Lumbar Spine Complete  Result Date: 05/17/2022 CLINICAL DATA:  Backpack EXAM: LUMBAR SPINE - COMPLETE 4+ VIEW COMPARISON:  None Available. FINDINGS: There is no evidence of lumbar spine fracture. Alignment is normal. Intervertebral disc spaces are maintained. There are minimal degenerative endplate osteophytes at L3-L4. Degenerative changes affect the lower lumbar facet joints. IMPRESSION: Mild degenerative changes in the lumbar spine. No fracture or malalignment. Electronically Signed   By: Ronney Asters M.D.   On: 05/17/2022 21:22    Procedures Procedures    Medications Ordered in ED Medications - No data to display  ED Course/ Medical Decision Making/ A&P  Patient presenting with low back pain as described in the HPI.  Symptoms seem very musculoskeletal in nature and I suspect some sort of radiculopathy/sciatica.  Patient to be treated with prednisone and hydrocodone.  She will be given a work excuse, and advised to  rest for the next several days.  To follow-up with doctor next week if not improving.  There are no red flags that would suggest an emergently surgical condition.  There are no bowel or bladder complaints and strength/reflexes are symmetrical.  Final Clinical Impression(s) / ED Diagnoses Final diagnoses:  None    Rx / DC Orders ED Discharge Orders     None         Veryl Speak, MD 05/18/22 [id number]

## 2023-04-11 ENCOUNTER — Other Ambulatory Visit (INDEPENDENT_AMBULATORY_CARE_PROVIDER_SITE_OTHER): Payer: Medicare Other

## 2023-04-11 ENCOUNTER — Ambulatory Visit (INDEPENDENT_AMBULATORY_CARE_PROVIDER_SITE_OTHER): Payer: Medicare Other | Admitting: Orthopedic Surgery

## 2023-04-11 ENCOUNTER — Encounter: Payer: Self-pay | Admitting: Orthopedic Surgery

## 2023-04-11 DIAGNOSIS — M25561 Pain in right knee: Secondary | ICD-10-CM

## 2023-04-11 DIAGNOSIS — M25571 Pain in right ankle and joints of right foot: Secondary | ICD-10-CM | POA: Diagnosis not present

## 2023-04-11 DIAGNOSIS — G8929 Other chronic pain: Secondary | ICD-10-CM

## 2023-04-11 MED ORDER — METHYLPREDNISOLONE ACETATE 40 MG/ML IJ SUSP
40.0000 mg | INTRAMUSCULAR | Status: AC | PRN
  Administered 2023-04-11: 40 mg via INTRA_ARTICULAR

## 2023-04-11 MED ORDER — LIDOCAINE HCL (PF) 1 % IJ SOLN
5.0000 mL | INTRAMUSCULAR | Status: AC | PRN
  Administered 2023-04-11: 5 mL

## 2023-04-11 MED ORDER — LIDOCAINE HCL 1 % IJ SOLN
2.0000 mL | INTRAMUSCULAR | Status: AC | PRN
  Administered 2023-04-11: 2 mL
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
## 2023-11-07 ENCOUNTER — Emergency Department (HOSPITAL_BASED_OUTPATIENT_CLINIC_OR_DEPARTMENT_OTHER): Admitting: Radiology

## 2023-11-07 ENCOUNTER — Other Ambulatory Visit: Payer: Self-pay

## 2023-11-07 DIAGNOSIS — R112 Nausea with vomiting, unspecified: Secondary | ICD-10-CM | POA: Diagnosis not present

## 2023-11-07 DIAGNOSIS — R000 Tachycardia, unspecified: Secondary | ICD-10-CM | POA: Diagnosis not present

## 2023-11-07 DIAGNOSIS — E860 Dehydration: Secondary | ICD-10-CM | POA: Diagnosis not present

## 2023-11-07 DIAGNOSIS — Z79899 Other long term (current) drug therapy: Secondary | ICD-10-CM | POA: Insufficient documentation

## 2023-11-07 DIAGNOSIS — R531 Weakness: Secondary | ICD-10-CM | POA: Diagnosis present

## 2023-11-07 DIAGNOSIS — D72829 Elevated white blood cell count, unspecified: Secondary | ICD-10-CM | POA: Insufficient documentation

## 2023-11-07 DIAGNOSIS — Z7901 Long term (current) use of anticoagulants: Secondary | ICD-10-CM | POA: Diagnosis not present

## 2023-11-07 LAB — COMPREHENSIVE METABOLIC PANEL WITH GFR
ALT: 28 U/L (ref 0–44)
AST: 32 U/L (ref 15–41)
Albumin: 4.4 g/dL (ref 3.5–5.0)
Alkaline Phosphatase: 120 U/L (ref 38–126)
Anion gap: 16 — ABNORMAL HIGH (ref 5–15)
BUN: 14 mg/dL (ref 8–23)
CO2: 18 mmol/L — ABNORMAL LOW (ref 22–32)
Calcium: 9.3 mg/dL (ref 8.9–10.3)
Chloride: 105 mmol/L (ref 98–111)
Creatinine, Ser: 1.13 mg/dL — ABNORMAL HIGH (ref 0.44–1.00)
GFR, Estimated: 53 mL/min — ABNORMAL LOW (ref >60)
Glucose, Bld: 180 mg/dL — ABNORMAL HIGH (ref 70–99)
Potassium: 4.2 mmol/L (ref 3.5–5.1)
Sodium: 139 mmol/L (ref 135–145)
Total Bilirubin: 0.6 mg/dL (ref 0.0–1.2)
Total Protein: 7.5 g/dL (ref 6.5–8.1)

## 2023-11-07 LAB — CBC
HCT: 46.1 % — ABNORMAL HIGH (ref 36.0–46.0)
Hemoglobin: 15.3 g/dL — ABNORMAL HIGH (ref 12.0–15.0)
MCH: 28.5 pg (ref 26.0–34.0)
MCHC: 33.2 g/dL (ref 30.0–36.0)
MCV: 85.8 fL (ref 80.0–100.0)
Platelets: 282 K/uL (ref 150–400)
RBC: 5.37 MIL/uL — ABNORMAL HIGH (ref 3.87–5.11)
RDW: 13.3 % (ref 11.5–15.5)
WBC: 23.9 K/uL — ABNORMAL HIGH (ref 4.0–10.5)
nRBC: 0 % (ref 0.0–0.2)

## 2023-11-07 LAB — TROPONIN T, HIGH SENSITIVITY: Troponin T High Sensitivity: <15 ng/L (ref <19)

## 2023-11-07 LAB — CBG MONITORING, ED: Glucose-Capillary: 171 mg/dL — ABNORMAL HIGH (ref 70–99)

## 2023-11-07 NOTE — ED Triage Notes (Signed)
 Pt reports she was out dancing when she got sudden generalized weakness, shaky, pale, and NV. No cp/sob. No syncope. S/s started around 7 pm - and still doesn't feel quiet right.

## 2023-11-07 NOTE — ED Notes (Signed)
 Pt tired to urinate. But could not.

## 2023-11-08 ENCOUNTER — Emergency Department (HOSPITAL_BASED_OUTPATIENT_CLINIC_OR_DEPARTMENT_OTHER)
Admission: EM | Admit: 2023-11-08 | Discharge: 2023-11-08 | Disposition: A | Discharge status: Home / Self Care | Attending: Emergency Medicine | Admitting: Emergency Medicine

## 2023-11-08 DIAGNOSIS — R531 Weakness: Secondary | ICD-10-CM

## 2023-11-08 DIAGNOSIS — E860 Dehydration: Secondary | ICD-10-CM | POA: Diagnosis not present

## 2023-11-08 LAB — URINALYSIS, ROUTINE W REFLEX MICROSCOPIC
Bilirubin Urine: NEGATIVE
Glucose, UA: NEGATIVE mg/dL
Hgb urine dipstick: NEGATIVE
Ketones, ur: NEGATIVE mg/dL
Leukocytes,Ua: NEGATIVE
Nitrite: NEGATIVE
Protein, ur: NEGATIVE mg/dL
Specific Gravity, Urine: 1.015 (ref 1.005–1.030)
pH: 5.5 (ref 5.0–8.0)

## 2023-11-08 LAB — TROPONIN T, HIGH SENSITIVITY: Troponin T High Sensitivity: <15 ng/L (ref <19)

## 2023-11-08 MED ORDER — ONDANSETRON HCL 4 MG/2ML IJ SOLN
4.0000 mg | Freq: Once | INTRAMUSCULAR | Status: AC | PRN
  Administered 2023-11-08: 4 mg via INTRAVENOUS
  Filled 2023-11-08: qty 2

## 2023-11-08 MED ORDER — ONDANSETRON HCL 4 MG/2ML IJ SOLN: 4.0000 mg | Freq: Once | INTRAMUSCULAR | Status: DC

## 2023-11-08 MED ORDER — SODIUM CHLORIDE 0.9 % IV BOLUS
1000.0000 mL | Freq: Once | INTRAVENOUS | Status: AC
  Administered 2023-11-08: 1000 mL via INTRAVENOUS
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
## 2023-11-08 NOTE — Discharge Instructions (Signed)
   RETURN IMMEDIATELY IF  you develop new shortness of breath, chest pain, fever, have difficulty moving parts of your body (new weakness, numbness, or incoordination), sudden change in speech, vision, swallowing, or understanding, faint or develop new dizziness, severe headache, become poorly responsive or have an altered mental status compared to baseline for you, new rash, abdominal pain, or bloody stools,  Return sooner also if you develop new problems for which you have not talked to your caregiver but you feel may be emergency medical conditions.

## 2023-11-08 NOTE — ED Notes (Signed)
 urine

## 2023-11-09 ENCOUNTER — Inpatient Hospital Stay (HOSPITAL_BASED_OUTPATIENT_CLINIC_OR_DEPARTMENT_OTHER)
Admission: EM | Admit: 2023-11-09 | Discharge: 2023-11-18 | DRG: 603 | Disposition: A | Discharge status: Home / Self Care | Source: Ambulatory Visit | Attending: Internal Medicine | Admitting: Internal Medicine

## 2023-11-09 ENCOUNTER — Other Ambulatory Visit: Payer: Self-pay

## 2023-11-09 ENCOUNTER — Emergency Department (HOSPITAL_BASED_OUTPATIENT_CLINIC_OR_DEPARTMENT_OTHER)

## 2023-11-09 ENCOUNTER — Encounter (HOSPITAL_BASED_OUTPATIENT_CLINIC_OR_DEPARTMENT_OTHER): Payer: Self-pay

## 2023-11-09 DIAGNOSIS — Z7985 Long-term (current) use of injectable non-insulin antidiabetic drugs: Secondary | ICD-10-CM

## 2023-11-09 DIAGNOSIS — A419 Sepsis, unspecified organism: Secondary | ICD-10-CM

## 2023-11-09 DIAGNOSIS — I10 Essential (primary) hypertension: Secondary | ICD-10-CM | POA: Diagnosis present

## 2023-11-09 DIAGNOSIS — R110 Nausea: Secondary | ICD-10-CM | POA: Diagnosis present

## 2023-11-09 DIAGNOSIS — E860 Dehydration: Secondary | ICD-10-CM | POA: Diagnosis present

## 2023-11-09 DIAGNOSIS — Z8249 Family history of ischemic heart disease and other diseases of the circulatory system: Secondary | ICD-10-CM

## 2023-11-09 DIAGNOSIS — Z6841 Body Mass Index (BMI) 40.0 and over, adult: Secondary | ICD-10-CM

## 2023-11-09 DIAGNOSIS — Z7901 Long term (current) use of anticoagulants: Secondary | ICD-10-CM

## 2023-11-09 DIAGNOSIS — N179 Acute kidney failure, unspecified: Secondary | ICD-10-CM | POA: Diagnosis present

## 2023-11-09 DIAGNOSIS — E66813 Obesity, class 3: Secondary | ICD-10-CM | POA: Diagnosis present

## 2023-11-09 DIAGNOSIS — I872 Venous insufficiency (chronic) (peripheral): Secondary | ICD-10-CM | POA: Diagnosis present

## 2023-11-09 DIAGNOSIS — M6282 Rhabdomyolysis: Secondary | ICD-10-CM | POA: Diagnosis present

## 2023-11-09 DIAGNOSIS — Z86711 Personal history of pulmonary embolism: Secondary | ICD-10-CM | POA: Diagnosis not present

## 2023-11-09 DIAGNOSIS — M7989 Other specified soft tissue disorders: Secondary | ICD-10-CM | POA: Diagnosis present

## 2023-11-09 DIAGNOSIS — E876 Hypokalemia: Secondary | ICD-10-CM | POA: Diagnosis present

## 2023-11-09 DIAGNOSIS — L0390 Cellulitis, unspecified: Secondary | ICD-10-CM | POA: Diagnosis present

## 2023-11-09 DIAGNOSIS — L299 Pruritus, unspecified: Secondary | ICD-10-CM | POA: Diagnosis present

## 2023-11-09 DIAGNOSIS — L03115 Cellulitis of right lower limb: Secondary | ICD-10-CM | POA: Diagnosis not present

## 2023-11-09 DIAGNOSIS — Z794 Long term (current) use of insulin: Secondary | ICD-10-CM

## 2023-11-09 DIAGNOSIS — Z79899 Other long term (current) drug therapy: Secondary | ICD-10-CM

## 2023-11-09 DIAGNOSIS — E119 Type 2 diabetes mellitus without complications: Secondary | ICD-10-CM | POA: Diagnosis present

## 2023-11-09 DIAGNOSIS — Z823 Family history of stroke: Secondary | ICD-10-CM

## 2023-11-09 DIAGNOSIS — D75839 Thrombocytosis, unspecified: Secondary | ICD-10-CM | POA: Diagnosis present

## 2023-11-09 DIAGNOSIS — Z86718 Personal history of other venous thrombosis and embolism: Secondary | ICD-10-CM

## 2023-11-09 DIAGNOSIS — L270 Generalized skin eruption due to drugs and medicaments taken internally: Secondary | ICD-10-CM | POA: Diagnosis present

## 2023-11-09 DIAGNOSIS — E782 Mixed hyperlipidemia: Secondary | ICD-10-CM | POA: Diagnosis present

## 2023-11-09 LAB — LACTIC ACID, PLASMA
Lactic Acid, Venous: 1.7 mmol/L (ref 0.5–1.9)
Lactic Acid, Venous: 2.3 mmol/L (ref 0.5–1.9)

## 2023-11-09 LAB — CBC WITH DIFFERENTIAL/PLATELET
Abs Immature Granulocytes: 0.09 K/uL — ABNORMAL HIGH (ref 0.00–0.07)
Basophils Absolute: 0.1 K/uL (ref 0.0–0.1)
Basophils Relative: 0 %
Eosinophils Absolute: 0 K/uL (ref 0.0–0.5)
Eosinophils Relative: 0 %
HCT: 40.3 % (ref 36.0–46.0)
Hemoglobin: 13.5 g/dL (ref 12.0–15.0)
Immature Granulocytes: 1 %
Lymphocytes Relative: 16 %
Lymphs Abs: 2.7 K/uL (ref 0.7–4.0)
MCH: 28.3 pg (ref 26.0–34.0)
MCHC: 33.5 g/dL (ref 30.0–36.0)
MCV: 84.5 fL (ref 80.0–100.0)
Monocytes Absolute: 1.3 K/uL — ABNORMAL HIGH (ref 0.1–1.0)
Monocytes Relative: 8 %
Neutro Abs: 12.7 K/uL — ABNORMAL HIGH (ref 1.7–7.7)
Neutrophils Relative %: 75 %
Platelets: 211 K/uL (ref 150–400)
RBC: 4.77 MIL/uL (ref 3.87–5.11)
RDW: 13.7 % (ref 11.5–15.5)
WBC: 16.9 K/uL — ABNORMAL HIGH (ref 4.0–10.5)
nRBC: 0 % (ref 0.0–0.2)

## 2023-11-09 LAB — COMPREHENSIVE METABOLIC PANEL WITH GFR
ALT: 35 U/L (ref 0–44)
AST: 56 U/L — ABNORMAL HIGH (ref 15–41)
Albumin: 3.8 g/dL (ref 3.5–5.0)
Alkaline Phosphatase: 105 U/L (ref 38–126)
Anion gap: 10 (ref 5–15)
BUN: 17 mg/dL (ref 8–23)
CO2: 27 mmol/L (ref 22–32)
Calcium: 9 mg/dL (ref 8.9–10.3)
Chloride: 102 mmol/L (ref 98–111)
Creatinine, Ser: 1.24 mg/dL — ABNORMAL HIGH (ref 0.44–1.00)
GFR, Estimated: 48 mL/min — ABNORMAL LOW (ref >60)
Glucose, Bld: 136 mg/dL — ABNORMAL HIGH (ref 70–99)
Potassium: 3.4 mmol/L — ABNORMAL LOW (ref 3.5–5.1)
Sodium: 139 mmol/L (ref 135–145)
Total Bilirubin: 1 mg/dL (ref 0.0–1.2)
Total Protein: 7 g/dL (ref 6.5–8.1)

## 2023-11-09 MED ORDER — ONDANSETRON HCL 4 MG/2ML IJ SOLN: 4.0000 mg | Freq: Four times a day (QID) | INTRAMUSCULAR | Status: DC | PRN

## 2023-11-09 MED ORDER — IOHEXOL 300 MG/ML  SOLN
100.0000 mL | Freq: Once | INTRAMUSCULAR | Status: AC | PRN
  Administered 2023-11-09: 95 mL via INTRAVENOUS

## 2023-11-09 MED ORDER — ACETAMINOPHEN 650 MG RE SUPP: 650.0000 mg | Freq: Four times a day (QID) | RECTAL | Status: DC | PRN

## 2023-11-09 MED ORDER — SODIUM CHLORIDE 0.9 % IV SOLN: INTRAVENOUS | Status: AC

## 2023-11-09 MED ORDER — VANCOMYCIN HCL IN DEXTROSE 1-5 GM/200ML-% IV SOLN
1000.0000 mg | INTRAVENOUS | Status: DC
  Filled 2023-11-09: qty 200

## 2023-11-09 MED ORDER — ACETAMINOPHEN 325 MG PO TABS
650.0000 mg | ORAL_TABLET | Freq: Four times a day (QID) | ORAL | Status: DC | PRN
  Administered 2023-11-10 – 2023-11-13 (×2): 650 mg via ORAL
  Filled 2023-11-09 (×2): qty 2

## 2023-11-09 MED ORDER — HYDROCODONE-ACETAMINOPHEN 5-325 MG PO TABS
1.0000 | ORAL_TABLET | ORAL | Status: DC | PRN
  Administered 2023-11-10 – 2023-11-13 (×2): 1 via ORAL
  Filled 2023-11-09 (×2): qty 1

## 2023-11-09 MED ORDER — ONDANSETRON HCL 4 MG PO TABS: 4.0000 mg | ORAL_TABLET | Freq: Four times a day (QID) | ORAL | Status: DC | PRN

## 2023-11-09 MED ORDER — APIXABAN 2.5 MG PO TABS
2.5000 mg | ORAL_TABLET | Freq: Two times a day (BID) | ORAL | Status: DC
  Administered 2023-11-09 – 2023-11-18 (×18): 2.5 mg via ORAL
  Filled 2023-11-09 (×18): qty 1

## 2023-11-09 MED ORDER — VANCOMYCIN HCL 2000 MG/400ML IV SOLN
2000.0000 mg | Freq: Once | INTRAVENOUS | Status: AC
  Administered 2023-11-10: 2000 mg via INTRAVENOUS
  Filled 2023-11-09: qty 400

## 2023-11-09 MED ORDER — INSULIN ASPART 100 UNIT/ML IJ SOLN
0.0000 [IU] | Freq: Three times a day (TID) | INTRAMUSCULAR | Status: DC
  Administered 2023-11-11 – 2023-11-16 (×9): 2 [IU] via SUBCUTANEOUS
  Administered 2023-11-17: 3 [IU] via SUBCUTANEOUS
  Administered 2023-11-17: 2 [IU] via SUBCUTANEOUS

## 2023-11-09 MED ORDER — EZETIMIBE 10 MG PO TABS
10.0000 mg | ORAL_TABLET | Freq: Every day | ORAL | Status: DC
  Administered 2023-11-10 – 2023-11-18 (×9): 10 mg via ORAL
  Filled 2023-11-09 (×9): qty 1

## 2023-11-09 MED ORDER — ATORVASTATIN CALCIUM 20 MG PO TABS: 20.0000 mg | ORAL_TABLET | Freq: Every day | ORAL | Status: DC

## 2023-11-09 MED ORDER — SODIUM CHLORIDE 0.9 % IV SOLN
2.0000 g | Freq: Two times a day (BID) | INTRAVENOUS | Status: DC
  Administered 2023-11-09: 2 g via INTRAVENOUS
  Filled 2023-11-09: qty 12.5

## 2023-11-09 NOTE — Assessment & Plan Note (Signed)
 Continue Eliquis .

## 2023-11-09 NOTE — Assessment & Plan Note (Signed)
 Order sliding scale hold Ozempic  check hemoglobin A1c

## 2023-11-09 NOTE — Assessment & Plan Note (Signed)
-  admit per  cellulitis protocol will       Vancomycin  and cefepime  started 11/09/23 ct showed:   no evidence of air  no evidence of osteomyelitis   no               foreign   objects     Will obtain MRSA screening,       obtain blood cultures  if febrile or septic     further antibiotic adjustment pending above results

## 2023-11-09 NOTE — ED Provider Notes (Signed)
 [place] EMERGENCY DEPARTMENT AT [facility] Provider Note   CSN: [id number] Arrival date & time: 11/09/23  1413     Patient presents with: Wound Infection   Claudia Holmes is a 68 y.o. female.   Patient to ED with progressively worsening right lower extremity pain and swelling, previously seen earlier today by Urgent Care who recommended ED evaluation for cellulitis. She reports low grade fever at home. No nausea, vomiting. No chest pain or SOB, no cough. No urinary symptoms. Seen in the ED yesterday for evaluation of generalized weakness.   The history is provided by the patient. No language interpreter was used.       Prior to Admission medications   Medication Sig Start Date End Date Taking? Authorizing Provider  acetaminophen  (TYLENOL ) 500 MG tablet Take 500 mg by mouth every 6 (six) hours as needed.    [provider]  atorvastatin  (LIPITOR) 20 MG tablet Take 20 mg by mouth daily.    [provider]  ELIQUIS  2.5 MG TABS tablet Take 2.5 mg by mouth 2 (two) times daily. 04/18/19   [provider]  ezetimibe  (ZETIA ) 10 MG tablet Take 10 mg by mouth daily. 03/10/19   [provider]  Semaglutide , 2 MG/DOSE, (OZEMPIC , 2 MG/DOSE,) 8 MG/3ML SOPN Inject 2 mg into the skin once a week. 02/15/22     tirzepatide (MOUNJARO) 5 MG/0.5ML Pen inject 5mg  12/28/20   [provider]    Allergies: Patient has no known allergies.    Review of Systems  Updated Vital Signs BP (!) 118/37 (BP Location: Right Arm)   Pulse 79   Temp 98.5 F (36.9 C) (Oral)   Resp 18   Ht 5' 4 (1.626 m)   Wt 108.9 kg   SpO2 99%   BMI 41.20 kg/m   Physical Exam Vitals and nursing note reviewed.  Constitutional:      Appearance: Normal appearance. She is obese.  HENT:     Mouth/Throat:     Mouth: Mucous membranes are moist.  Cardiovascular:     Rate and Rhythm: Normal rate and regular rhythm.     Heart sounds: No murmur heard. Pulmonary:      Effort: Pulmonary effort is normal.     Breath sounds: No wheezing, rhonchi or rales.  Abdominal:     Palpations: Abdomen is soft.     Tenderness: There is no abdominal tenderness.  Musculoskeletal:     Cervical back: Normal range of motion and neck supple.     Comments: Significant graft scarring to bilateral lower extremities. Right lower leg tender to touch, greatest tenderness in posterior calf. The lower leg is generally erythematous. Warm to touch, extending to the foot. No thigh erythema or redness.   Skin:    Findings: Erythema present.  Neurological:     Mental Status: She is alert.          (all labs ordered are listed, but only abnormal results are displayed) Labs Reviewed  CBC WITH DIFFERENTIAL/PLATELET - Abnormal; Notable for the following components:      Result Value   WBC 16.9 (*)    Neutro Abs 12.7 (*)    Monocytes Absolute 1.3 (*)    Abs Immature Granulocytes 0.09 (*)    All other components within normal limits  COMPREHENSIVE METABOLIC PANEL WITH GFR - Abnormal; Notable for the following components:   Potassium 3.4 (*)    Glucose, Bld 136 (*)    Creatinine, Ser 1.24 (*)  AST 56 (*)    GFR, Estimated 48 (*)    All other components within normal limits  CULTURE, BLOOD (ROUTINE X 2)  CULTURE, BLOOD (ROUTINE X 2)  LACTIC ACID, PLASMA  LACTIC ACID, PLASMA   Results for orders placed or performed during the hospital encounter of 11/09/23  CBC with Differential   Collection Time: 11/09/23  5:11 PM  Result Value Ref Range   WBC 16.9 (H) 4.0 - 10.5 K/uL   RBC 4.77 3.87 - 5.11 MIL/uL   Hemoglobin 13.5 12.0 - 15.0 g/dL   HCT 59.6 63.9 - 53.9 %   MCV 84.5 80.0 - 100.0 fL   MCH 28.3 26.0 - 34.0 pg   MCHC 33.5 30.0 - 36.0 g/dL   RDW 86.2 88.4 - 84.4 %   Platelets 211 150 - 400 K/uL   nRBC 0.0 0.0 - 0.2 %   Neutrophils Relative % 75 %   Neutro Abs 12.7 (H) 1.7 - 7.7 K/uL   Lymphocytes Relative 16 %   Lymphs Abs 2.7 0.7 - 4.0 K/uL   Monocytes Relative 8  %   Monocytes Absolute 1.3 (H) 0.1 - 1.0 K/uL   Eosinophils Relative 0 %   Eosinophils Absolute 0.0 0.0 - 0.5 K/uL   Basophils Relative 0 %   Basophils Absolute 0.1 0.0 - 0.1 K/uL   Immature Granulocytes 1 %   Abs Immature Granulocytes 0.09 (H) 0.00 - 0.07 K/uL  Lactic acid, plasma   Collection Time: 11/09/23  5:11 PM  Result Value Ref Range   Lactic Acid, Venous 1.7 0.5 - 1.9 mmol/L  Comprehensive metabolic panel   Collection Time: 11/09/23  5:11 PM  Result Value Ref Range   Sodium 139 135 - 145 mmol/L   Potassium 3.4 (L) 3.5 - 5.1 mmol/L   Chloride 102 98 - 111 mmol/L   CO2 27 22 - 32 mmol/L   Glucose, Bld 136 (H) 70 - 99 mg/dL   BUN 17 8 - 23 mg/dL   Creatinine, Ser 8.75 (H) 0.44 - 1.00 mg/dL   Calcium  9.0 8.9 - 10.3 mg/dL   Total Protein 7.0 6.5 - 8.1 g/dL   Albumin 3.8 3.5 - 5.0 g/dL   AST 56 (H) 15 - 41 U/L   ALT 35 0 - 44 U/L   Alkaline Phosphatase 105 38 - 126 U/L   Total Bilirubin 1.0 0.0 - 1.2 mg/dL   GFR, Estimated 48 (L) >60 mL/min   Anion gap 10 5 - 15    EKG: None  Radiology: CT EXTREMITY LOWER RIGHT W CONTRAST Result Date: 11/09/2023 CLINICAL DATA:  Soft tissue infection EXAM: CT OF THE LOWER RIGHT EXTREMITY WITH CONTRAST TECHNIQUE: Multidetector CT imaging of the lower right extremity was performed according to the standard protocol following intravenous contrast administration. RADIATION DOSE REDUCTION: This exam was performed according to the departmental dose-optimization program which includes automated exposure control, adjustment of the mA and/or kV according to patient size and/or use of iterative reconstruction technique. CONTRAST:  95mL OMNIPAQUE  IOHEXOL  300 MG/ML  SOLN COMPARISON:  Knee series 04/11/2023 FINDINGS: Bones/Joint/Cartilage No acute bony abnormality. Specifically, no acute fracture, subluxation, or dislocation. No joint effusion within the right knee. Deformity noted in the region of the right fibular neck, likely related to old injury. This  is unchanged since prior study Ligaments Suboptimally assessed by CT. Muscles and Tendons Unremarkable. Soft tissues Edema within the subcutaneous soft tissues in the right calf and ankle. No focal fluid collection to suggest abscess. IMPRESSION:  No acute bony abnormality. Edema within the subcutaneous soft tissues in the right calf and ankle. This could reflect cellulitis in the appropriate clinical setting. No focal fluid collection/abscess. Electronically Signed   By: Franky Crease M.D.   On: 11/09/2023 18:05   DG Chest 2 View Result Date: 11/08/2023 CLINICAL DATA:  Generalized weakness. EXAM: CHEST - 2 VIEW COMPARISON:  December 09, 2017 FINDINGS: The heart size and mediastinal contours are within normal limits. Low lung volumes are noted with mild, stable elevation of the right hemidiaphragm. No acute infiltrate, pleural effusion or pneumothorax is identified. The visualized skeletal structures are unremarkable. IMPRESSION: No active cardiopulmonary disease. Electronically Signed   By: Suzen Dials M.D.   On: 11/08/2023 00:14     Procedures   Medications Ordered in the ED  iohexol  (OMNIPAQUE ) 300 MG/ML solution 100 mL (95 mLs Intravenous Contrast Given 11/09/23 1758)                                    Medical Decision Making This patient presents to the ED for concern of lower extremity pain, this involves an extensive number of treatment options, and is a complaint that carries with it a high risk of complications and morbidity.  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  The pertinent results include:   CBC: WBC 16.9, normal hgb, normal plts Cmet: K+ 3.4, glucose  136, Cr 1.24, AST 56 - otherwise normal values Blood cultures pending Lactic acid 1.7   Imaging Studies ordered:  I ordered imaging studies including CT right LE Per radiologist interpretation:  IMPRESSION: No acute bony abnormality.   Edema within the subcutaneous soft tissues in the right calf and ankle. This could reflect cellulitis in the appropriate clinical setting. No focal fluid collection/abscess.   Cardiac Monitoring:  The patient was maintained on a cardiac monitor.  I personally viewed and interpreted the cardiac monitored which showed an underlying rhythm of: n/a   Medicines ordered and prescription drug management: Rocephin 1 gram given prior to arrival   Test Considered:  N/a   Critical Interventions:  N/a   Consultations Obtained:  I requested consultation with the n/a,  and discussed lab and imaging findings as well as pertinent plan - they recommend: n/a   Problem List / ED Course:  Here with redness, pain and swelling to right lower leg. +leukocytosis, CT findings c/w cellulitis H/O T2DM Antibiotics started PTA, cultures pending Discussed hospitalization for further treatment given medical history and anatomic abnormalities of extensive grafting to bilateral LE's.  Hospital accepted the patient to their service [place])   Reevaluation:  After the interventions noted above, I reevaluated the patient and found that they have :stayed the same   Social Determinants of Health:  Never a smoker   Disposition:  After consideration of the diagnostic results and the patients response to treatment, I feel that the patient would benefit from admit.   Amount and/or Complexity of Data Reviewed Labs: ordered. Radiology: ordered.  Risk Prescription drug management. Decision regarding hospitalization.        Final diagnoses:  Cellulitis of right lower extremity    ED Discharge  Orders     None          Odell Margit RIGGERS 11/09/23 2042    Patsey Lot, MD 11/09/23 [phone]

## 2023-11-09 NOTE — ED Triage Notes (Addendum)
 Sent by UC for eval of cellulitis to right leg. Redness and mild pain noted. PMH skin graphs to bilateral legs from accident. Administered Rocephin 1 gm IM PTA by urgent care.

## 2023-11-09 NOTE — Plan of Care (Signed)
 Plan of Care Note for accepted transfer   Patient name: Claudia Holmes FMW:[id number] DOB: 24-Apr-1956  Facility requesting transfer: [facility] Requesting Provider: Odell Balls, PA-C Reason for transfer: Right lower extremity cellulitis Facility course: 68 year old female with past medical history of anxiety, hyperlipidemia, type 2 diabetes, DVT/PE on chronic anticoagulation, remote history of crush injury to her right lower extremity requiring extensive skin grafting sent to the ED from urgent care for evaluation of cellulitis of her right lower extremity.  She was given a dose of Rocephin at urgent care.  Vital signs stable.  CT without evidence of osteomyelitis or abscess.  Labs notable for WBC count 16.9, potassium 3.4, glucose 136, creatinine 1.2, AST 56 and remainder of LFTs normal, lactic acid normal.  Plan of care: The patient is accepted for admission to Med-surg  unit at [facility].  [facility] will assume care on arrival to accepting facility. Until arrival, care as per EDP. However, [facility] available 24/7 for questions and assistance.  Check [email or web address] for on-call coverage.  Nursing staff, please call [facility] Admits & Consults System-Wide number under Amion on patient's arrival so appropriate admitting provider can evaluate the pt.

## 2023-11-09 NOTE — H&P (Signed)
 Claudia Holmes FMW:[id number] DOB: 12/14/1955 DOA: 11/09/2023     PCP: Yolande Toribio MATSU, MD   Orthopedics Dr. Harden  Patient arrived to ER on 11/09/23 at 1413 Referred by Attending Silvester Ales, MD   Patient coming from:    home Lives alone,       Chief Complaint:   Chief Complaint  Patient presents with   Wound Infection    HPI: Claudia Holmes is a 68 y.o. 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  Worrisome for cellulitis white blood cell count elevated to 16 Was seen at [facility] started on IV Rocephin and transferred to [facility]     Denies significant ETOH intake   Does not smoke       Regarding pertinent Chronic problems:    Hyperlipidemia -  on statins Lipitor (atorvastatin ) Zetia     HTN not an meds        DM 2 -  Lab Results  Component Value Date   HGBA1C (H) 04/10/2010    5.7 (NOTE)                                                                       According to the ADA Clinical Practice Recommendations for 2011, when HbA1c is used as a screening test:   >=6.5%   Diagnostic of Diabetes Mellitus           (if abnormal result  is confirmed)  5.7-6.4%   Increased risk of developing Diabetes Mellitus  References:Diagnosis and Classification of Diabetes Mellitus,Diabetes Care,2011,34(Suppl 1):S62-S69 and Standards of Medical  Care in         Diabetes - 2011,Diabetes Care,2011,34  (Suppl 1):S11-S61. CORRECTED ON 12/05 AT 2304: PREVIOUSLY REPORTED AS 5.7 Reference range: <5.7   on ozempic     Morbid obesity-   BMI Readings from Last 1 Encounters:  11/09/23 41.20 kg/m    Hx of DVT/PE on - anticoagulation with   Eliquis ,     While in ER:     CT imaging showed no evidence of air    Lab Orders         Culture, blood (routine x 2)         CBC with Differential         Lactic acid, plasma         Comprehensive metabolic panel        CXR -  NON acute from 7 /4//2025    Following Medications were ordered in ER: Medications  iohexol  (OMNIPAQUE ) 300 MG/ML solution 100 mL (95 mLs Intravenous Contrast Given 11/09/23 1758)  ED Triage Vitals  Encounter Vitals Group     BP 11/09/23 1427 (!) 128/56     Girls Systolic BP Percentile --      Girls Diastolic BP Percentile --      Boys Systolic BP Percentile --      Boys Diastolic BP Percentile --      Pulse Rate 11/09/23 1427 87     Resp 11/09/23 1427 17     Temp 11/09/23 1427 98.4 F (36.9 C)     Temp Source 11/09/23 1427 Oral     SpO2 11/09/23 1427 95 %     Weight 11/09/23 1432 240 lb (108.9 kg)     Height 11/09/23 1432 5' 4 (1.626 m)     Head Circumference --      Peak Flow --      Pain Score 11/09/23 1432 4     Pain Loc --      Pain Education --      Exclude from Growth Chart --   UFJK(75)@     _________________________________________ Significant initial  Findings: Abnormal Labs Reviewed  CBC WITH DIFFERENTIAL/PLATELET - Abnormal; Notable for the following components:      Result Value   WBC 16.9 (*)    Neutro Abs 12.7 (*)    Monocytes Absolute 1.3 (*)    Abs Immature Granulocytes 0.09 (*)    All other components within normal limits  COMPREHENSIVE METABOLIC PANEL WITH GFR - Abnormal; Notable for the following components:   Potassium 3.4 (*)    Glucose, Bld 136 (*)    Creatinine, Ser 1.24 (*)    AST 56 (*)    GFR, Estimated 48 (*)     All other components within normal limits     ECG: Ordered   The recent clinical data is shown below. Vitals:   11/09/23 1432 11/09/23 1850 11/09/23 2050 11/09/23 2156  BP:  (!) 118/37 (!) 147/68 (!) 147/61  Pulse:  79 92 89  Resp:  18 18 19   Temp:  98.5 F (36.9 C) 98.4 F (36.9 C) 98.6 F (37 C)  TempSrc:  Oral Oral Oral  SpO2:  99% 99% 99%  Weight: 108.9 kg     Height: 5' 4 (1.626 m)   5' 4 (1.626 m)      WBC     Component Value Date/Time   WBC 16.9 (H) 11/09/2023 1711   LYMPHSABS 2.7 11/09/2023 1711   MONOABS 1.3 (H) 11/09/2023 1711   EOSABS 0.0 11/09/2023 1711   BASOSABS 0.1 11/09/2023 1711     Lactic Acid, Venous    Component Value Date/Time   LATICACIDVEN 1.7 11/09/2023 1711         UA   no evidence of UTI    Urine analysis:    Component Value Date/Time   COLORURINE YELLOW 11/08/2023 0315   APPEARANCEUR CLEAR 11/08/2023 0315   LABSPEC 1.015 11/08/2023 0315   PHURINE 5.5 11/08/2023 0315   GLUCOSEU NEGATIVE 11/08/2023 0315   HGBUR NEGATIVE 11/08/2023 0315   BILIRUBINUR NEGATIVE 11/08/2023 0315   KETONESUR NEGATIVE 11/08/2023 0315   PROTEINUR NEGATIVE 11/08/2023 0315   UROBILINOGEN 0.2 04/10/2010 1824   NITRITE NEGATIVE 11/08/2023 0315   LEUKOCYTESUR NEGATIVE 11/08/2023 0315    Results for orders placed or performed in visit on 11/20/16  Body Fluid Culture     Status: None   Collection Time: 11/20/16  4:36 PM   Specimen: Synovial, Right Knee; Body Fluid  Result Value Ref Range Status  Gram Stain Rare  Final   Gram Stain WBC present-predominately Mononuclear  Final   Gram Stain No Organisms Seen  Final   Organism ID, Bacteria NO GROWTH  Final    ABX started Rocephin     __________________________________________________________ Recent Labs  Lab 11/07/23 2111 11/09/23 1711  NA 139 139  K 4.2 3.4*  CO2 18* 27  GLUCOSE 180* 136*  BUN 14 17  CREATININE 1.13* 1.24*  CALCIUM  9.3 9.0    Cr   Up from baseline see below Lab Results   Component Value Date   CREATININE 1.24 (H) 11/09/2023   CREATININE 1.13 (H) 11/07/2023   CREATININE 1.00 12/09/2017    Recent Labs  Lab 11/07/23 2111 11/09/23 1711  AST 32 56*  ALT 28 35  ALKPHOS 120 105  BILITOT 0.6 1.0  PROT 7.5 7.0  ALBUMIN 4.4 3.8   Lab Results  Component Value Date   CALCIUM  9.0 11/09/2023   PHOS 3.3 04/11/2010    Plt: Lab Results  Component Value Date   PLT 211 11/09/2023       Recent Labs  Lab 11/07/23 2111 11/09/23 1711  WBC 23.9* 16.9*  NEUTROABS  --  12.7*  HGB 15.3* 13.5  HCT 46.1* 40.3  MCV 85.8 84.5  PLT 282 211    HG/HCT   stable,      Component Value Date/Time   HGB 13.5 11/09/2023 1711   HCT 40.3 11/09/2023 1711   MCV 84.5 11/09/2023 1711    _______________________________________________ Hospitalist was called for admission for   Cellulitis of right lower extremity    The following Work up has been ordered so far:  Orders Placed This Encounter  Procedures   Culture, blood (routine x 2)   CT EXTREMITY LOWER RIGHT W CONTRAST   CBC with Differential   Lactic acid, plasma   Comprehensive metabolic panel   Consult to hospitalist   EKG   Place in observation (patient's expected length of stay will be less than 2 midnights)     OTHER Significant initial  Findings:  labs showing:     DM  labs:  HbA1C: No results for input(s): HGBA1C in the last 8760 hours.     CBG (last 3)  Recent Labs    11/07/23 2058  GLUCAP 171*          Cultures:    Component Value Date/Time   SDES CATH TIP 04/16/2010 1843   SPECREQUEST NONE 04/16/2010 1843   CULT NO GROWTH 2 DAYS 04/16/2010 1843   REPTSTATUS 04/19/2010 FINAL 04/16/2010 1843     Radiological Exams on Admission: CT EXTREMITY LOWER RIGHT W CONTRAST Result Date: 11/09/2023 CLINICAL DATA:  Soft tissue infection EXAM: CT OF THE LOWER RIGHT EXTREMITY WITH CONTRAST TECHNIQUE: Multidetector CT imaging of the lower right extremity was performed according to the  standard protocol following intravenous contrast administration. RADIATION DOSE REDUCTION: This exam was performed according to the departmental dose-optimization program which includes automated exposure control, adjustment of the mA and/or kV according to patient size and/or use of iterative reconstruction technique. CONTRAST:  95mL OMNIPAQUE  IOHEXOL  300 MG/ML  SOLN COMPARISON:  Knee series 04/11/2023 FINDINGS: Bones/Joint/Cartilage No acute bony abnormality. Specifically, no acute fracture, subluxation, or dislocation. No joint effusion within the right knee. Deformity noted in the region of the right fibular neck, likely related to old injury. This is unchanged since prior study Ligaments Suboptimally assessed by CT. Muscles and Tendons Unremarkable. Soft tissues Edema within the subcutaneous soft tissues in the  right calf and ankle. No focal fluid collection to suggest abscess. IMPRESSION: No acute bony abnormality. Edema within the subcutaneous soft tissues in the right calf and ankle. This could reflect cellulitis in the appropriate clinical setting. No focal fluid collection/abscess. Electronically Signed   By: Franky Crease M.D.   On: 11/09/2023 18:05   DG Chest 2 View Result Date: 11/08/2023 CLINICAL DATA:  Generalized weakness. EXAM: CHEST - 2 VIEW COMPARISON:  December 09, 2017 FINDINGS: The heart size and mediastinal contours are within normal limits. Low lung volumes are noted with mild, stable elevation of the right hemidiaphragm. No acute infiltrate, pleural effusion or pneumothorax is identified. The visualized skeletal structures are unremarkable. IMPRESSION: No active cardiopulmonary disease. Electronically Signed   By: Suzen Dials M.D.   On: 11/08/2023 00:14   _______________________________________________________________________________________________________ Latest  Blood pressure (!) 147/61, pulse 89, temperature 98.6 F (37 C), temperature source Oral, resp. rate 19, height 5' 4  (1.626 m), weight 108.9 kg, SpO2 99%.   Vitals  labs and radiology finding personally reviewed  Review of Systems:    Pertinent positives include:  chills, fatigue,   Nausea, right leg swelling Constitutional:  No weight loss, night sweats, Fevers, weight loss  HEENT:  No headaches, Difficulty swallowing,Tooth/dental problems,Sore throat,  No sneezing, itching, ear ache, nasal congestion, post nasal drip,  Cardio-vascular:  No chest pain, Orthopnea, PND, anasarca, dizziness, palpitations.no Bilateral lower extremity swelling  GI:  No heartburn, indigestion, abdominal pain,, vomiting, diarrhea, change in bowel habits, loss of appetite, melena, blood in stool, hematemesis Resp:  no shortness of breath at rest. No dyspnea on exertion, No excess mucus, no productive cough, No non-productive cough, No coughing up of blood.No change in color of mucus.No wheezing. Skin:  no rash or lesions. No jaundice GU:  no dysuria, change in color of urine, no urgency or frequency. No straining to urinate.  No flank pain.  Musculoskeletal:  No joint pain or no joint swelling. No decreased range of motion. No back pain.  Psych:  No change in mood or affect. No depression or anxiety. No memory loss.  Neuro: no localizing neurological complaints, no tingling, no weakness, no double vision, no gait abnormality, no slurred speech, no confusion  All systems reviewed and apart from HOPI all are negative _______________________________________________________________________________________________ Past Medical History:   Past Medical History:  Diagnosis Date   Anxiety    High cholesterol       Past Surgical History:  Procedure Laterality Date   arm surgery     LEG SURGERY      Social History:  Ambulatory   independently      reports that she has never smoked. She has never used smokeless tobacco. She reports that she does not drink alcohol and does not use drugs.   Family History:     Family History  Problem Relation Age of Onset   Stroke Mother    Heart attack Father    Stroke Maternal Grandmother    Heart disease Paternal Grandmother    ______________________________________________________________________________________________ Allergies: No Known Allergies   Prior to Admission medications   Medication Sig Start Date End Date Taking? Authorizing Provider  acetaminophen  (TYLENOL ) 500 MG tablet Take 500 mg by mouth every 6 (six) hours as needed.    [provider]  atorvastatin  (LIPITOR) 20 MG tablet Take 20 mg by mouth daily.    [provider]  ELIQUIS  2.5 MG TABS tablet Take 2.5 mg by mouth 2 (two) times daily. 04/18/19  [provider]  ezetimibe  (ZETIA ) 10 MG tablet Take 10 mg by mouth daily. 03/10/19   [provider]  Semaglutide , 2 MG/DOSE, (OZEMPIC , 2 MG/DOSE,) 8 MG/3ML SOPN Inject 2 mg into the skin once a week. 02/15/22     tirzepatide (MOUNJARO) 5 MG/0.5ML Pen inject 5mg  12/28/20   [provider]    ___________________________________________________________________________________________________ Physical Exam:    11/09/2023    9:56 PM 11/09/2023    8:50 PM 11/09/2023    6:50 PM  Vitals with BMI  Height 5' 4    Systolic 147 147 881  Diastolic 61 68 37  Pulse 89 92 79     1. General:  in No  Acute distress   Chronically ill   -appearing 2. Psychological: Alert and   Oriented 3. Head/ENT:  Dry Mucous Membranes                          Head Non traumatic, neck supple                          Poor Dentition 4. SKIN:  decreased Skin turgor,  Skin clean Dry and intact  redness over right leg    5. Heart: Regular rate and rhythm no  Murmur, no Rub or gallop 6. Lungs:  no wheezes or crackles   7. Abdomen: Soft,  non-tender, Non distended   obese  bowel sounds present 8. Lower extremities: no clubbing, cyanosis, right > left edema 9. Neurologically Grossly intact, moving all 4 extremities equally    10. MSK: Normal range of motion    Chart has been reviewed  ______________________________________________________________________________________________  Assessment/Plan 68 y.o. female with medical history significant of DM2, HTN HLD, bilateral lower extremity skin graft, DVT/PE  Admitted for   Cellulitis of right lower extremity, AKI     Present on Admission:  Cellulitis  History of pulmonary embolism  Mixed hyperlipidemia  AKI (acute kidney injury) (HCC)  Dehydration  Hypokalemia     Cellulitis -admit per  cellulitis protocol will       Vancomycin  and cefepime  started 11/09/23 ct showed:   no evidence of air  no evidence of osteomyelitis   no               foreign   objects     Will obtain MRSA screening,       obtain blood cultures  if febrile or septic     further antibiotic adjustment pending above results   History of pulmonary embolism Continue Eliquis   Mixed hyperlipidemia Continue Lipitor 20 mg a day and Zetia  10 mg a day  AKI (acute kidney injury) (HCC) Likely secondary to dehydration will rehydrate and recheck renal function  Dehydration Rehydrate with IV fluids  Hypokalemia Replace and recheck check magnesium  and phosphate levels  DM2 (diabetes mellitus, type 2) (HCC) Order sliding scale hold Ozempic  check hemoglobin A1c    Other plan as per orders.  DVT prophylaxis: Eliquis     Code Status:    Code Status: Prior FULL CODE  as per patient    I had personally discussed CODE STATUS with patient  ACP   none   Family Communication:   Family not at  Bedside    Diet diabetic   Disposition Plan:    To home once workup is complete and patient is stable   Following barriers for discharge:  Electrolytes corrected                                                           Pain controlled with PO medications                                white count improving able to transition to PO  antibiotics                                 Consult Orders  (From admission, onward)           Start     Ordered   11/09/23 1952  Consult to hospitalist  Called [facility] for consult at 8:04p spoke with Suzen  Once       Provider:  (Not yet assigned)  Question Answer Comment  Place call to: [facility]   Reason for Consult Admit      11/09/23 1952                               Consults called:    NONE   Admission status:  ED Disposition     ED Disposition  Admit   Condition  --   Comment  Hospital Area: [facility] [[id number]]  Level of Care: Med-Surg [16]  Interfacility transfer: Yes  May place patient in observation at [facility] or [facility] if equivalent level of care is available:: Yes  Covid Evaluation: Asymptomatic - no recent exposure (last 10 days) testing not required  Diagnosis: Cellulitis [[id number]]  Admitting Physician: BOBBETTE HERCULES [[id number]]  Attending Physician: PATSEY LOT [[id number]]           Obs      Level of care       medical floor         Blease Quiver 11/09/2023, 11:04 PM    [facility]     after 2 AM please page floor coverage   If 7AM-7PM, please contact the day team taking care of the patient using [email or web address]

## 2023-11-09 NOTE — Subjective & Objective (Signed)
 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  Worrisome for cellulitis white blood cell count elevated to 16 Was seen at [facility] started on IV Rocephin and transferred to [facility]

## 2023-11-09 NOTE — Assessment & Plan Note (Signed)
 Likely secondary to dehydration will rehydrate and recheck renal function

## 2023-11-09 NOTE — Assessment & Plan Note (Signed)
 Hold Lipitor given elevated ck

## 2023-11-09 NOTE — Progress Notes (Signed)
 Pharmacy Antibiotic Note  Claudia Holmes is a 67 y.o. female admitted on 11/09/2023 with RLE cellulitis.  Pharmacy has been consulted for Vancomycin  and Cefepime  dosing.  Plan: Cefepime  2g IV q12h Vancomycin  2g IV x 1 followed by Vancomycin  1000 mg IV Q 24 hrs. Goal AUC 400-550.  Expected AUC: 504.7  SCr used: 1.24 Follow renal function F/u culture results and sensitivities  Height: 5' 4 (162.6 cm) Weight: 108.9 kg (240 lb) IBW/kg (Calculated) : 54.7  Temp (24hrs), Avg:98.5 F (36.9 C), Min:98.4 F (36.9 C), Max:98.6 F (37 C)  Recent Labs  Lab 11/07/23 2111 11/09/23 1711  WBC 23.9* 16.9*  CREATININE 1.13* 1.24*  LATICACIDVEN  --  1.7    Estimated Creatinine Clearance: 53.8 mL/min (A) (by C-G formula based on SCr of 1.24 mg/dL (H)).    No Known Allergies  Antimicrobials this admission: 7/5 Cefepime  >>   7/5 Vancomycin  >>    Dose adjustments this admission:    Microbiology results: 7/5 BCx:      Thank you for allowing pharmacy to be a part of this patient's care.  Sequan Auxier, PharmD 11/09/2023 11:03 PM

## 2023-11-09 NOTE — Assessment & Plan Note (Signed)
Replace and recheck check magnesium and phosphate levels

## 2023-11-09 NOTE — Assessment & Plan Note (Signed)
 Rehydrate with IV fluids

## 2023-11-10 DIAGNOSIS — L270 Generalized skin eruption due to drugs and medicaments taken internally: Secondary | ICD-10-CM | POA: Diagnosis present

## 2023-11-10 DIAGNOSIS — Z79899 Other long term (current) drug therapy: Secondary | ICD-10-CM | POA: Diagnosis not present

## 2023-11-10 DIAGNOSIS — I872 Venous insufficiency (chronic) (peripheral): Secondary | ICD-10-CM | POA: Diagnosis present

## 2023-11-10 DIAGNOSIS — Z86718 Personal history of other venous thrombosis and embolism: Secondary | ICD-10-CM | POA: Diagnosis not present

## 2023-11-10 DIAGNOSIS — E119 Type 2 diabetes mellitus without complications: Secondary | ICD-10-CM | POA: Diagnosis present

## 2023-11-10 DIAGNOSIS — L539 Erythematous condition, unspecified: Secondary | ICD-10-CM | POA: Diagnosis not present

## 2023-11-10 DIAGNOSIS — Z794 Long term (current) use of insulin: Secondary | ICD-10-CM | POA: Diagnosis not present

## 2023-11-10 DIAGNOSIS — Z823 Family history of stroke: Secondary | ICD-10-CM | POA: Diagnosis not present

## 2023-11-10 DIAGNOSIS — E860 Dehydration: Secondary | ICD-10-CM | POA: Diagnosis present

## 2023-11-10 DIAGNOSIS — Z7901 Long term (current) use of anticoagulants: Secondary | ICD-10-CM | POA: Diagnosis not present

## 2023-11-10 DIAGNOSIS — L03115 Cellulitis of right lower limb: Secondary | ICD-10-CM | POA: Diagnosis present

## 2023-11-10 DIAGNOSIS — Z6841 Body Mass Index (BMI) 40.0 and over, adult: Secondary | ICD-10-CM | POA: Diagnosis not present

## 2023-11-10 DIAGNOSIS — Z8249 Family history of ischemic heart disease and other diseases of the circulatory system: Secondary | ICD-10-CM | POA: Diagnosis not present

## 2023-11-10 DIAGNOSIS — L0390 Cellulitis, unspecified: Secondary | ICD-10-CM | POA: Diagnosis present

## 2023-11-10 DIAGNOSIS — M6282 Rhabdomyolysis: Secondary | ICD-10-CM | POA: Diagnosis present

## 2023-11-10 DIAGNOSIS — R110 Nausea: Secondary | ICD-10-CM | POA: Diagnosis present

## 2023-11-10 DIAGNOSIS — E66813 Obesity, class 3: Secondary | ICD-10-CM | POA: Diagnosis present

## 2023-11-10 DIAGNOSIS — M7989 Other specified soft tissue disorders: Secondary | ICD-10-CM | POA: Diagnosis present

## 2023-11-10 DIAGNOSIS — E876 Hypokalemia: Secondary | ICD-10-CM | POA: Diagnosis present

## 2023-11-10 DIAGNOSIS — N179 Acute kidney failure, unspecified: Secondary | ICD-10-CM

## 2023-11-10 DIAGNOSIS — I10 Essential (primary) hypertension: Secondary | ICD-10-CM | POA: Diagnosis present

## 2023-11-10 DIAGNOSIS — D75839 Thrombocytosis, unspecified: Secondary | ICD-10-CM | POA: Diagnosis present

## 2023-11-10 DIAGNOSIS — Z86711 Personal history of pulmonary embolism: Secondary | ICD-10-CM | POA: Diagnosis not present

## 2023-11-10 DIAGNOSIS — L299 Pruritus, unspecified: Secondary | ICD-10-CM | POA: Diagnosis present

## 2023-11-10 DIAGNOSIS — E782 Mixed hyperlipidemia: Secondary | ICD-10-CM | POA: Diagnosis present

## 2023-11-10 DIAGNOSIS — D72829 Elevated white blood cell count, unspecified: Secondary | ICD-10-CM | POA: Diagnosis not present

## 2023-11-10 DIAGNOSIS — Z7985 Long-term (current) use of injectable non-insulin antidiabetic drugs: Secondary | ICD-10-CM | POA: Diagnosis not present

## 2023-11-10 LAB — BASIC METABOLIC PANEL WITH GFR
Anion gap: 10 (ref 5–15)
BUN: 16 mg/dL (ref 8–23)
CO2: 22 mmol/L (ref 22–32)
Calcium: 8.3 mg/dL — ABNORMAL LOW (ref 8.9–10.3)
Chloride: 103 mmol/L (ref 98–111)
Creatinine, Ser: 1.05 mg/dL — ABNORMAL HIGH (ref 0.44–1.00)
GFR, Estimated: 59 mL/min — ABNORMAL LOW (ref >60)
Glucose, Bld: 208 mg/dL — ABNORMAL HIGH (ref 70–99)
Potassium: 3.2 mmol/L — ABNORMAL LOW (ref 3.5–5.1)
Sodium: 135 mmol/L (ref 135–145)

## 2023-11-10 LAB — COMPREHENSIVE METABOLIC PANEL WITH GFR
ALT: 27 U/L (ref 0–44)
AST: 41 U/L (ref 15–41)
Albumin: 2.9 g/dL — ABNORMAL LOW (ref 3.5–5.0)
Alkaline Phosphatase: 74 U/L (ref 38–126)
Anion gap: 10 (ref 5–15)
BUN: 14 mg/dL (ref 8–23)
CO2: 22 mmol/L (ref 22–32)
Calcium: 7.6 mg/dL — ABNORMAL LOW (ref 8.9–10.3)
Chloride: 105 mmol/L (ref 98–111)
Creatinine, Ser: 0.92 mg/dL (ref 0.44–1.00)
GFR, Estimated: >60 mL/min (ref >60)
Glucose, Bld: 121 mg/dL — ABNORMAL HIGH (ref 70–99)
Potassium: 3.7 mmol/L (ref 3.5–5.1)
Sodium: 137 mmol/L (ref 135–145)
Total Bilirubin: 1.3 mg/dL — ABNORMAL HIGH (ref 0.0–1.2)
Total Protein: 6 g/dL — ABNORMAL LOW (ref 6.5–8.1)

## 2023-11-10 LAB — HEMOGLOBIN A1C
Hgb A1c MFr Bld: 5.9 % — ABNORMAL HIGH (ref 4.8–5.6)
Mean Plasma Glucose: 122.63 mg/dL

## 2023-11-10 LAB — LACTIC ACID, PLASMA
Lactic Acid, Venous: 1 mmol/L (ref 0.5–1.9)
Lactic Acid, Venous: 0.9 mmol/L (ref 0.5–1.9)

## 2023-11-10 LAB — MRSA NEXT GEN BY PCR, NASAL: MRSA by PCR Next Gen: NOT DETECTED

## 2023-11-10 LAB — MAGNESIUM
Magnesium: 1.9 mg/dL (ref 1.7–2.4)
Magnesium: 1.7 mg/dL (ref 1.7–2.4)

## 2023-11-10 LAB — SODIUM, URINE, RANDOM: Sodium, Ur: 105 mmol/L

## 2023-11-10 LAB — GLUCOSE, CAPILLARY
Glucose-Capillary: 136 mg/dL — ABNORMAL HIGH (ref 70–99)
Glucose-Capillary: 139 mg/dL — ABNORMAL HIGH (ref 70–99)
Glucose-Capillary: 104 mg/dL — ABNORMAL HIGH (ref 70–99)

## 2023-11-10 LAB — PHOSPHORUS
Phosphorus: 1.5 mg/dL — ABNORMAL LOW (ref 2.5–4.6)
Phosphorus: 2.6 mg/dL (ref 2.5–4.6)

## 2023-11-10 LAB — CBC
HCT: 31.6 % — ABNORMAL LOW (ref 36.0–46.0)
Hemoglobin: 10.2 g/dL — ABNORMAL LOW (ref 12.0–15.0)
MCH: 28.4 pg (ref 26.0–34.0)
MCHC: 32.3 g/dL (ref 30.0–36.0)
MCV: 88 fL (ref 80.0–100.0)
Platelets: 165 K/uL (ref 150–400)
RBC: 3.59 MIL/uL — ABNORMAL LOW (ref 3.87–5.11)
RDW: 13.8 % (ref 11.5–15.5)
WBC: 12.8 K/uL — ABNORMAL HIGH (ref 4.0–10.5)
nRBC: 0 % (ref 0.0–0.2)

## 2023-11-10 LAB — OSMOLALITY: Osmolality: 298 mosm/kg — ABNORMAL HIGH (ref 275–295)

## 2023-11-10 LAB — CK
Total CK: 2049 U/L — ABNORMAL HIGH (ref 38–234)
Total CK: 1655 U/L — ABNORMAL HIGH (ref 38–234)

## 2023-11-10 LAB — OSMOLALITY, URINE: Osmolality, Ur: 816 mosm/kg (ref 300–900)

## 2023-11-10 LAB — PREALBUMIN: Prealbumin: 6 mg/dL — ABNORMAL LOW (ref 18–38)

## 2023-11-10 LAB — HIV ANTIBODY (ROUTINE TESTING W REFLEX): HIV Screen 4th Generation wRfx: NONREACTIVE

## 2023-11-10 LAB — CREATININE, URINE, RANDOM: Creatinine, Urine: 132 mg/dL

## 2023-11-10 MED ORDER — SODIUM CHLORIDE 0.9% FLUSH: 10.0000 mL | INTRAVENOUS | Status: DC | PRN

## 2023-11-10 MED ORDER — SODIUM CHLORIDE 0.9 % IV BOLUS
500.0000 mL | Freq: Once | INTRAVENOUS | Status: AC
  Administered 2023-11-10: 500 mL via INTRAVENOUS

## 2023-11-10 MED ORDER — MAGNESIUM SULFATE 2 GM/50ML IV SOLN
2.0000 g | Freq: Once | INTRAVENOUS | Status: AC
  Administered 2023-11-10: 2 g via INTRAVENOUS
  Filled 2023-11-10: qty 50

## 2023-11-10 MED ORDER — POTASSIUM CHLORIDE 10 MEQ/100ML IV SOLN
10.0000 meq | INTRAVENOUS | Status: AC
  Administered 2023-11-10 (×4): 10 meq via INTRAVENOUS
  Filled 2023-11-10 (×4): qty 100

## 2023-11-10 MED ORDER — VANCOMYCIN HCL IN DEXTROSE 1-5 GM/200ML-% IV SOLN: 1000.0000 mg | INTRAVENOUS | Status: DC

## 2023-11-10 MED ORDER — LACTATED RINGERS IV SOLN: INTRAVENOUS | Status: AC

## 2023-11-10 MED ORDER — VANCOMYCIN HCL 1250 MG/250ML IV SOLN
1250.0000 mg | Freq: Every day | INTRAVENOUS | Status: DC
  Administered 2023-11-10: 1250 mg via INTRAVENOUS
  Filled 2023-11-10: qty 250

## 2023-11-10 MED ORDER — SODIUM CHLORIDE 0.9 % IV SOLN
2.0000 g | Freq: Three times a day (TID) | INTRAVENOUS | Status: DC
  Administered 2023-11-10 – 2023-11-12 (×7): 2 g via INTRAVENOUS
  Filled 2023-11-10 (×7): qty 12.5

## 2023-11-10 MED ORDER — POTASSIUM PHOSPHATES 15 MMOLE/5ML IV SOLN
15.0000 mmol | Freq: Once | INTRAVENOUS | Status: AC
  Administered 2023-11-10: 15 mmol via INTRAVENOUS
  Filled 2023-11-10: qty 5

## 2023-11-10 NOTE — Assessment & Plan Note (Signed)
-   Holding home Lipitor due to concern of rhabdomyolysis

## 2023-11-10 NOTE — Plan of Care (Signed)
   Problem: Education: Goal: Knowledge of General Education information will improve Description Including pain rating scale, medication(s)/side effects and non-pharmacologic comfort measures Outcome: Progressing

## 2023-11-10 NOTE — Assessment & Plan Note (Signed)
 Continue Eliquis .

## 2023-11-10 NOTE — Assessment & Plan Note (Signed)
 Improved with potassium of 3.7 today -Continue to monitor and replete as needed

## 2023-11-10 NOTE — Progress Notes (Signed)
 Pharmacy Antibiotic Note  Claudia Holmes is a 68 y.o. female admitted on 11/09/2023 with RLE cellulitis.  Pharmacy has been consulted for Vancomycin  and Cefepime  dosing.  Today, 11/10/2023: WBC elevated but improving Afebrile since admission SCr improved significantly overnight  Plan: Increase cefepime  to 2g IV q8 hr Increase vancomycin  to 1250 mg IV q24 hr (eAUC 480 w/ SCr 0.92; Vd 0.5) SCr q48 while on vancomycin   Height: 5' 4 (162.6 cm) Weight: 108.9 kg (240 lb) IBW/kg (Calculated) : 54.7  Temp (24hrs), Avg:98.7 F (37.1 C), Min:98.4 F (36.9 C), Max:99.2 F (37.3 C)  Recent Labs  Lab 11/07/23 2111 11/09/23 1711 11/09/23 2208 11/09/23 2209 11/10/23 0141 11/10/23 0441  WBC 23.9* 16.9*  --   --  12.8*  --   CREATININE 1.13* 1.24* 1.05*  --   --  0.92  LATICACIDVEN  --  1.7  --  2.3* 1.0 0.9    Estimated Creatinine Clearance: 72.5 mL/min (by C-G formula based on SCr of 0.92 mg/dL).    No Known Allergies  Antimicrobials this admission: 7/5 Cefepime  >>   7/5 Vancomycin  >>    Dose adjustments this admission: 7/6 incr vanc 1000 >> 1250 mg q24 with improved renal function  Microbiology results: 7/5 BCx: ngtd    Thank you for allowing pharmacy to be a part of this patient's care.  Bard Jeans, PharmD, BCPS [phone] 11/10/2023, 8:24 AM

## 2023-11-10 NOTE — Assessment & Plan Note (Signed)
 Improved to 2.6 with repletion. - Continue to monitor

## 2023-11-10 NOTE — Assessment & Plan Note (Signed)
 CK elevated above 2000 on admission. Improved to 1655 today -Continue with IV fluid for another day -Monitor CK

## 2023-11-10 NOTE — Plan of Care (Signed)
 Pt tolerating all scheduled medications and interventions for hypokalemia. Lactic acid improved with interventions. Pt affect calm and pleasant. Waiting for pt to void for a urine sample. Will continue to monitor.

## 2023-11-10 NOTE — Assessment & Plan Note (Addendum)
 Rehydrate and follow CK Hold statin

## 2023-11-10 NOTE — Assessment & Plan Note (Signed)
Will replace and recheck 

## 2023-11-10 NOTE — Assessment & Plan Note (Signed)
 Patient with mildly elevated creatinine on admission likely due to dehydration as it quickly improved with IV fluid.  Not meeting the criteria for AKI so AKI ruled out. - Monitor renal function
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
## 2023-11-10 NOTE — Assessment & Plan Note (Addendum)
 Continue to have significant pain with erythema and edema of right lower extremity.  History of skin graft. MRSA PCR negative, preliminary blood cultures negative. - Continue IV antibiotics for another day -Continue with supportive care
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
## 2023-11-10 NOTE — Assessment & Plan Note (Signed)
 CBG within goal and A1c of 5.9. - Continue with SSI

## 2023-11-11 DIAGNOSIS — E876 Hypokalemia: Secondary | ICD-10-CM | POA: Diagnosis not present

## 2023-11-11 DIAGNOSIS — M6282 Rhabdomyolysis: Secondary | ICD-10-CM | POA: Diagnosis not present

## 2023-11-11 DIAGNOSIS — L03115 Cellulitis of right lower limb: Secondary | ICD-10-CM | POA: Diagnosis not present

## 2023-11-11 LAB — CBC
HCT: 34.9 % — ABNORMAL LOW (ref 36.0–46.0)
Hemoglobin: 11.3 g/dL — ABNORMAL LOW (ref 12.0–15.0)
MCH: 28 pg (ref 26.0–34.0)
MCHC: 32.4 g/dL (ref 30.0–36.0)
MCV: 86.4 fL (ref 80.0–100.0)
Platelets: 191 K/uL (ref 150–400)
RBC: 4.04 MIL/uL (ref 3.87–5.11)
RDW: 14 % (ref 11.5–15.5)
WBC: 12 K/uL — ABNORMAL HIGH (ref 4.0–10.5)
nRBC: 0 % (ref 0.0–0.2)

## 2023-11-11 LAB — GLUCOSE, CAPILLARY
Glucose-Capillary: 113 mg/dL — ABNORMAL HIGH (ref 70–99)
Glucose-Capillary: 112 mg/dL — ABNORMAL HIGH (ref 70–99)
Glucose-Capillary: 122 mg/dL — ABNORMAL HIGH (ref 70–99)
Glucose-Capillary: 141 mg/dL — ABNORMAL HIGH (ref 70–99)

## 2023-11-11 LAB — RENAL FUNCTION PANEL
Albumin: 2.6 g/dL — ABNORMAL LOW (ref 3.5–5.0)
Anion gap: 9 (ref 5–15)
BUN: 12 mg/dL (ref 8–23)
CO2: 21 mmol/L — ABNORMAL LOW (ref 22–32)
Calcium: 7.4 mg/dL — ABNORMAL LOW (ref 8.9–10.3)
Chloride: 105 mmol/L (ref 98–111)
Creatinine, Ser: 0.84 mg/dL (ref 0.44–1.00)
GFR, Estimated: >60 mL/min (ref >60)
Glucose, Bld: 115 mg/dL — ABNORMAL HIGH (ref 70–99)
Phosphorus: 2.6 mg/dL (ref 2.5–4.6)
Potassium: 3.5 mmol/L (ref 3.5–5.1)
Sodium: 135 mmol/L (ref 135–145)

## 2023-11-11 LAB — CK: Total CK: 1044 U/L — ABNORMAL HIGH (ref 38–234)

## 2023-11-11 MED ORDER — VANCOMYCIN HCL 1500 MG/300ML IV SOLN
1500.0000 mg | Freq: Every day | INTRAVENOUS | Status: DC
  Administered 2023-11-11: 1500 mg via INTRAVENOUS
  Filled 2023-11-11: qty 300

## 2023-11-11 MED ORDER — LACTATED RINGERS IV SOLN: INTRAVENOUS | Status: AC

## 2023-11-11 NOTE — Assessment & Plan Note (Addendum)
 Worsening symptoms and became febrile overnight History of skin graft.  Worsening leukocytosis today MRSA PCR negative, preliminary blood cultures negative. -Consulting ID-Dr. Luiz will see the patient - Antibiotic switched with linezolid  -Continue with supportive care

## 2023-11-11 NOTE — Assessment & Plan Note (Signed)
 Patient with mildly elevated creatinine on admission likely due to dehydration as it quickly improved with IV fluid.  Not meeting the criteria for AKI so AKI ruled out. Creatinine is now less than 1. - Monitor renal function

## 2023-11-11 NOTE — Assessment & Plan Note (Signed)
 Potassium at 3.3 today -Continue to monitor and replete as needed

## 2023-11-11 NOTE — Progress Notes (Signed)
 Pharmacy Antibiotic Note  Claudia Holmes is a 68 y.o. female admitted on 11/09/2023 with RLE cellulitis.  Pharmacy has been consulted for Vancomycin  and Cefepime  dosing.  Today, 11/11/2023: WBC elevated but improving Tmax 100.8 SCr continues to improve  Plan: Continue cefepime  to 2g IV q8 hr Increase vancomycin  to 1500 mg IV q24 hr (eAUC 533.7 w/ SCr 0.84; Vd 0.5) SCr q48 while on vancomycin   Height: 5' 4 (162.6 cm) Weight: 108.9 kg (240 lb) IBW/kg (Calculated) : 54.7  Temp (24hrs), Avg:99.3 F (37.4 C), Min:98.3 F (36.8 C), Max:100.8 F (38.2 C)  Recent Labs  Lab 11/07/23 2111 11/09/23 1711 11/09/23 2208 11/09/23 2209 11/10/23 0141 11/10/23 0441 11/11/23 0345  WBC 23.9* 16.9*  --   --  12.8*  --  12.0*  CREATININE 1.13* 1.24* 1.05*  --   --  0.92 0.84  LATICACIDVEN  --  1.7  --  2.3* 1.0 0.9  --     Estimated Creatinine Clearance: 79.5 mL/min (by C-G formula based on SCr of 0.84 mg/dL).    Allergies  Allergen Reactions   Doxycycline Hyclate     Other Reaction(s): possible doxycycline vs IV contrast- rash   Prednisone  & Diphenhydramine      Other Reaction(s): possible contrast allergy vs doxycycline. rash.    Antimicrobials this admission: 7/5 Cefepime  >>   7/5 Vancomycin  >>    Dose adjustments this admission: 7/6 increase vancomycin  1000 mg >> 1250 mg q24 with improved renal function 7/7 increase vancomycin  to 1500 mg IV q24h  Microbiology results: 7/5 BCx: ngtd    Thank you for allowing pharmacy to be a part of this patient's care.  Eleanor EMERSON Agent, PharmD, BCPS Clinical Pharmacist [facility] 11/11/2023 7:43 AM

## 2023-11-11 NOTE — Assessment & Plan Note (Signed)
 Continue Eliquis .

## 2023-11-11 NOTE — Assessment & Plan Note (Signed)
 Improved to 2.6 with repletion. - Continue to monitor

## 2023-11-11 NOTE — Progress Notes (Signed)
 Progress Note   Patient: Claudia Holmes FMW:[id number] DOB: 1955/05/23 DOA: 11/09/2023     1 DOS: the patient was seen and examined on 11/11/2023   Brief hospital course: Taken from H&P.  Claudia Holmes is a 68 y.o. female with medical history significant of DM2, HTN HLD, bilateral lower extremity skin graft, DVT/PE, Presented with right lower extremity pain swelling fatigue nausea.  On presentation vital stable, labs with leukocytosis at 16.9, lactic acid 2.3>> 1.0, CK2 049, phosphorous 1.9, potassium 3.2, mildly elevated creatinine but not meeting the criteria for AKI. CT of right lower extremity with no concern of abscess or osteomyelitis.  Subcutaneous soft tissue edema consistent with cellulitis.  Electrolytes were repleted and patient was started on cefepime  and vancomycin .  7/6: Vital and labs stable.  Magnesium  of 1.7 which is being repleted.  MRSA PCR negative, preliminary blood cultures negative.  CK improving.  Continuing IV fluid and IV antibiotics for another day.  7/7: Vital stable, improving CK, bicarb of 21, leukocytosis at 12 today, continuing IV fluid for another day.  Assessment and Plan: * Cellulitis Improving pain and erythema today History of skin graft.  Slowly improving leukocytosis but remained elevated today at 12 MRSA PCR negative, preliminary blood cultures negative. - Continue IV antibiotics for another day -Continue with supportive care  AKI (acute kidney injury) (HCC) Patient with mildly elevated creatinine on admission likely due to dehydration as it quickly improved with IV fluid.  Not meeting the criteria for AKI so AKI ruled out. Creatinine is now less than 1. - Monitor renal function  Rhabdomyolysis CK elevated above 2000 on admission. Slowly improving, at 1044 today -Continue with IV fluid for another day -Monitor CK  Hypokalemia Improved with potassium of 3.5 today -Continue to monitor and replete as needed  Hypophosphatemia Improved to  2.6 with repletion. - Continue to monitor  DM2 (diabetes mellitus, type 2) (HCC) CBG within goal and A1c of 5.9. - Continue with SSI  History of pulmonary embolism - Continue Eliquis   Mixed hyperlipidemia - Holding home Lipitor due to concern of rhabdomyolysis   Subjective: Patient was seen and examined today.  Right lower extremity pain and swelling improving.  She was very concerned about her grafted skin.  Family at bedside  Physical Exam: Vitals:   11/10/23 1319 11/10/23 2144 11/11/23 0509 11/11/23 1308  BP: 115/64 (!) 134/59 121/60 (!) 126/34  Pulse: 76 88 86 89  Resp: 16 18 16 16   Temp: 98.3 F (36.8 C) (!) 100.8 F (38.2 C) 99.5 F (37.5 C) 98.1 F (36.7 C)  TempSrc: Oral Oral Oral Oral  SpO2: 98% 98% 99% 95%  Weight:      Height:       General.  Morbidly obese lady, in no acute distress. Pulmonary.  Lungs clear bilaterally, normal respiratory effort. CV.  Regular rate and rhythm, no JVD, rub or murmur. Abdomen.  Soft, nontender, nondistended, BS positive. CNS.  Alert and oriented .  No focal neurologic deficit. Extremities.  RLE mild edema and erythema which seems improving Psychiatry.  Judgment and insight appears normal.    Data Reviewed: Prior data reviewed  Family Communication: Discussed with patient and 2 aunts at bedside  Disposition: Status is: Observation The patient will require care spanning > 2 midnights and should be moved to inpatient because: Severity of illness-requiring IV antibiotics and IV fluid  Planned Discharge Destination: Home  DVT prophylaxis.  Eliquis  Time spent: 50 minutes  This record has been created using [employer]  voice recognition software. Errors have been sought and corrected,but may not always be located. Such creation errors do not reflect on the standard of care.   Author: Amaryllis Dare, MD 11/11/2023 4:19 PM  For on call review [email or web address].

## 2023-11-11 NOTE — Plan of Care (Signed)
  Problem: Elimination: Goal: Will not experience complications related to urinary retention Outcome: Progressing   Problem: Pain Managment: Goal: General experience of comfort will improve and/or be controlled Outcome: Progressing   Problem: Safety: Goal: Ability to remain free from injury will improve Outcome: Progressing   Problem: Metabolic: Goal: Ability to maintain appropriate glucose levels will improve Outcome: Progressing

## 2023-11-11 NOTE — Assessment & Plan Note (Signed)
 CK elevated above 2000 on admission. Slowly improving, at 1044 today -Continue with IV fluid for another day -Monitor CK

## 2023-11-12 DIAGNOSIS — L03115 Cellulitis of right lower limb: Secondary | ICD-10-CM | POA: Diagnosis not present

## 2023-11-12 DIAGNOSIS — E876 Hypokalemia: Secondary | ICD-10-CM | POA: Diagnosis not present

## 2023-11-12 DIAGNOSIS — M6282 Rhabdomyolysis: Secondary | ICD-10-CM | POA: Diagnosis not present

## 2023-11-12 LAB — BASIC METABOLIC PANEL WITH GFR
Anion gap: 11 (ref 5–15)
BUN: 9 mg/dL (ref 8–23)
CO2: 23 mmol/L (ref 22–32)
Calcium: 8.2 mg/dL — ABNORMAL LOW (ref 8.9–10.3)
Chloride: 106 mmol/L (ref 98–111)
Creatinine, Ser: 0.87 mg/dL (ref 0.44–1.00)
GFR, Estimated: >60 mL/min (ref >60)
Glucose, Bld: 140 mg/dL — ABNORMAL HIGH (ref 70–99)
Potassium: 3.3 mmol/L — ABNORMAL LOW (ref 3.5–5.1)
Sodium: 140 mmol/L (ref 135–145)

## 2023-11-12 LAB — CBC
HCT: 36.4 % (ref 36.0–46.0)
Hemoglobin: 12.3 g/dL (ref 12.0–15.0)
MCH: 28.9 pg (ref 26.0–34.0)
MCHC: 33.8 g/dL (ref 30.0–36.0)
MCV: 85.6 fL (ref 80.0–100.0)
Platelets: 276 K/uL (ref 150–400)
RBC: 4.25 MIL/uL (ref 3.87–5.11)
RDW: 14.3 % (ref 11.5–15.5)
WBC: 17.7 K/uL — ABNORMAL HIGH (ref 4.0–10.5)
nRBC: 0 % (ref 0.0–0.2)

## 2023-11-12 LAB — GLUCOSE, CAPILLARY
Glucose-Capillary: 123 mg/dL — ABNORMAL HIGH (ref 70–99)
Glucose-Capillary: 116 mg/dL — ABNORMAL HIGH (ref 70–99)
Glucose-Capillary: 107 mg/dL — ABNORMAL HIGH (ref 70–99)
Glucose-Capillary: 140 mg/dL — ABNORMAL HIGH (ref 70–99)

## 2023-11-12 LAB — CK: Total CK: 823 U/L — ABNORMAL HIGH (ref 38–234)

## 2023-11-12 MED ORDER — LINEZOLID 600 MG/300ML IV SOLN
600.0000 mg | Freq: Two times a day (BID) | INTRAVENOUS | Status: DC
  Administered 2023-11-12 – 2023-11-18 (×12): 600 mg via INTRAVENOUS
  Filled 2023-11-12 (×12): qty 300

## 2023-11-12 MED ORDER — POTASSIUM CHLORIDE CRYS ER 20 MEQ PO TBCR
40.0000 meq | EXTENDED_RELEASE_TABLET | Freq: Once | ORAL | Status: AC
  Administered 2023-11-12: 40 meq via ORAL
  Filled 2023-11-12: qty 2
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
## 2023-11-12 NOTE — Evaluation (Signed)
 Physical Therapy Evaluation Patient Details Name: Claudia Holmes MRN: [id number] DOB: Dec 19, 1955 Today's Date: 11/12/2023  History of Present Illness  68 y.o. female with medical history significant of DM2, HTN HLD, bilateral lower extremity skin graft, DVT/PE. Presented with right lower extremity pain swelling fatigue, nausea. admitted with cellulitis R LE, AKI, rhabdomyolysis  Clinical Impression  Pt admitted with above diagnosis.  Pt is pleasant, cooperative and motivated to mobilize with PT. Amb ~ 200' with RW and CGA fade to supervision. RLE with noted incr weeping after amb/dependent RLE positioning--provided with dermatherapy  bed pad under RLE for wicking of moisture and skin protection, elevated RLE with 2 pillows. Will follow in acute setting, no f/u recommended at this time.   Pt currently with functional limitations due to the deficits listed below (see PT Problem List). Pt will benefit from acute skilled PT to increase their independence and safety with mobility to allow discharge.           If plan is discharge home, recommend the following: Help with stairs or ramp for entrance;Assist for transportation   Can travel by private vehicle        Equipment Recommendations None recommended by PT  Recommendations for Other Services       Functional Status Assessment Patient has had a recent decline in their functional status and demonstrates the ability to make significant improvements in function in a reasonable and predictable amount of time.     Precautions / Restrictions Precautions Precautions: Fall Restrictions Weight Bearing Restrictions Per Provider Order: No      Mobility  Bed Mobility Overal bed mobility: Modified Independent                  Transfers Overall transfer level: Needs assistance Equipment used: Rolling walker (2 wheels) Transfers: Sit to/from Stand Sit to Stand: Min assist           General transfer comment: light assist to  rise, pt rocks/uses momentum    Ambulation/Gait Ambulation/Gait assistance: Contact guard assist, Supervision Gait Distance (Feet): 200 Feet Assistive device: Rolling walker (2 wheels) Gait Pattern/deviations: Step-through pattern, Decreased stance time - right, Decreased dorsiflexion - left, Decreased dorsiflexion - right       General Gait Details: walks on toes R foot d/t baseline pf contracture. good stability with RW, no LOB, lightly reliant on UEs.  Stairs            Wheelchair Mobility     Tilt Bed    Modified Rankin (Stroke Patients Only)       Balance Overall balance assessment: Mild deficits observed, not formally tested                                           Pertinent Vitals/Pain Pain Assessment Pain Assessment: Faces Faces Pain Scale: Hurts a little bit Pain Location: RLE Pain Descriptors / Indicators: Sore Pain Intervention(s): Limited activity within patient's tolerance, Monitored during session    Home Living Family/patient expects to be discharged to:: Private residence Living Arrangements: Alone   Type of Home: House Home Access: Stairs to enter Entrance Stairs-Rails: None Entrance Stairs-Number of Steps: 1   Home Layout: One level Home Equipment: Agricultural consultant (2 wheels);Cane - single point      Prior Function Prior Level of Function : Driving;Independent/Modified Independent  Mobility Comments: ind ADLs Comments: ind     Extremity/Trunk Assessment   Upper Extremity Assessment Upper Extremity Assessment: Overall WFL for tasks assessed    Lower Extremity Assessment Lower Extremity Assessment: LLE deficits/detail;RLE deficits/detail RLE Deficits / Details: pf contracture - baseline; knee AROM grossly WFL, strenght at least 3/5, NT furhter d/t cellulitis and hx of skin grafts RLE Coordination: decreased fine motor LLE Deficits / Details: grossly WFL--hx of flaps, grafts LLE LLE Coordination:  decreased fine motor       Communication   Communication Communication: No apparent difficulties    Cognition Arousal: Alert Behavior During Therapy: WFL for tasks assessed/performed   PT - Cognitive impairments: No apparent impairments                         Following commands: Intact       Cueing Cueing Techniques: Verbal cues     General Comments      Exercises     Assessment/Plan    PT Assessment Patient needs continued PT services  PT Problem List Decreased activity tolerance;Decreased knowledge of use of DME;Decreased mobility       PT Treatment Interventions DME instruction;Therapeutic exercise;Gait training;Functional mobility training;Therapeutic activities;Patient/family education    PT Goals (Current goals can be found in the Care Plan section)  Acute Rehab PT Goals Patient Stated Goal: home soon, get better PT Goal Formulation: With patient Time For Goal Achievement: 11/12/23 Potential to Achieve Goals: Good    Frequency Min 2X/week     Co-evaluation               AM-PAC PT 6 Clicks Mobility  Outcome Measure Help needed turning from your back to your side while in a flat bed without using bedrails?: None Help needed moving from lying on your back to sitting on the side of a flat bed without using bedrails?: None Help needed moving to and from a bed to a chair (including a wheelchair)?: A Little Help needed standing up from a chair using your arms (e.g., wheelchair or bedside chair)?: A Little Help needed to walk in hospital room?: A Little Help needed climbing 3-5 steps with a railing? : A Little 6 Click Score: 20    End of Session Equipment Utilized During Treatment: Gait belt Activity Tolerance: Patient tolerated treatment well Patient left: in chair;with call bell/phone within reach;with chair alarm set Nurse Communication: Mobility status PT Visit Diagnosis: Other abnormalities of gait and mobility (R26.89)    Time:  1452-1520 PT Time Calculation (min) (ACUTE ONLY): 28 min   Charges:   PT Evaluation $PT Eval Low Complexity: 1 Low PT Treatments $Gait Training: 8-22 mins PT General Charges $$ ACUTE PT VISIT: 1 Visit         Claudia Holmes, PT  Acute Rehab Dept [facility]) [phone]  11/12/2023   [facility] 11/12/2023, 3:29 PM

## 2023-11-12 NOTE — Progress Notes (Signed)
 Progress Note   Patient: Claudia Holmes FMW:[id number] DOB: [birth year] DOA: 11/09/2023     2 DOS: the patient was seen and examined on 11/12/2023   Brief hospital course: Taken from H&P.  ALMEDA EZRA is a 68 y.o. female with medical history significant of DM2, HTN HLD, bilateral lower extremity skin graft, DVT/PE, Presented with right lower extremity pain swelling fatigue nausea.  On presentation vital stable, labs with leukocytosis at 16.9, lactic acid 2.3>> 1.0, CK2 049, phosphorous 1.9, potassium 3.2, mildly elevated creatinine but not meeting the criteria for AKI. CT of right lower extremity with no concern of abscess or osteomyelitis.  Subcutaneous soft tissue edema consistent with cellulitis.  Electrolytes were repleted and patient was started on cefepime  and vancomycin .  7/6: Vital and labs stable.  Magnesium  of 1.7 which is being repleted.  MRSA PCR negative, preliminary blood cultures negative.  CK improving.  Continuing IV fluid and IV antibiotics for another day.  7/7: Vital stable, improving CK, bicarb of 21, leukocytosis at 12 today, continuing IV fluid for another day.  7/8: Patient became febrile at 100.2 overnight.  Worsening right lower extremity edema, erythema and now started oozing close to ankle.  Infectious disease was consulted, antibiotic switched with linezolid .  Worsening leukocytosis with mild hypokalemia and improving CK levels.  Assessment and Plan: * Cellulitis Worsening symptoms and became febrile overnight History of skin graft.  Worsening leukocytosis today MRSA PCR negative, preliminary blood cultures negative. -Consulting ID-Dr. Luiz will see the patient - Antibiotic switched with linezolid  -Continue with supportive care  AKI (acute kidney injury) [facility]) Patient with mildly elevated creatinine on admission likely due to dehydration as it quickly improved with IV fluid.  Not meeting the criteria for AKI so AKI ruled out. Creatinine is now less than  1. - Monitor renal function  Rhabdomyolysis CK elevated above 2000 on admission. Continue to improve, 823 today -Monitor CK  Hypokalemia Potassium at 3.3 today -Continue to monitor and replete as needed  Hypophosphatemia Improved to 2.6 with repletion. - Continue to monitor  DM2 (diabetes mellitus, type 2) (HCC) CBG within goal and A1c of 5.9. - Continue with SSI  History of pulmonary embolism - Continue Eliquis   Mixed hyperlipidemia - Holding home Lipitor due to concern of rhabdomyolysis   Subjective: Patient with worsening pain, weeping and swelling of right lower extremity.  Physical Exam: Vitals:   11/11/23 2111 11/12/23 0550 11/12/23 0611 11/12/23 1320  BP: (!) 154/62 (!) 107/34 (!) 158/77 (!) 126/55  Pulse: 83 84 93 76  Resp: 18 18 18 16   Temp: 100.2 F (37.9 C) 99.4 F (37.4 C)  98.9 F (37.2 C)  TempSrc: Oral Oral  Oral  SpO2: 96% 95% 95% 96%  Weight:      Height:       General.  Morbidly obese lady, in no acute distress. Pulmonary.  Lungs clear bilaterally, normal respiratory effort. CV.  Regular rate and rhythm, no JVD, rub or murmur. Abdomen.  Soft, nontender, nondistended, BS positive. CNS.  Alert and oriented .  No focal neurologic deficit. Extremities.  Significant edema, erythema and hyperthermia with some weeping around ankle of right lower extremity on the grafted skin. Psychiatry.  Judgment and insight appears normal.   Data Reviewed: Prior data reviewed  Family Communication: Discussed with patient  Disposition: Status is: Inpatient  inpatient because: Severity of illness-requiring IV antibiotics and IV fluid  Planned Discharge Destination: Home  DVT prophylaxis.  Eliquis  Time spent: 50 minutes  This record  has been created using Conservation officer, historic buildings. Errors have been sought and corrected,but may not always be located. Such creation errors do not reflect on the standard of care.   Author: Amaryllis Dare, MD 11/12/2023  2:32 PM  For on call review [email or web address].

## 2023-11-12 NOTE — TOC Transition Note (Signed)
 Transition of Care [facility]) - Discharge Note   Patient Details  Name: Claudia Holmes MRN: [id number] Date of Birth: 08/20/1955  Transition of [facility]) CM/SW Contact:  Alfonse JONELLE Rex, RN Phone Number: 11/12/2023, 1:41 PM   Clinical Narrative:   patient resides in private residence, has PCP on file. ID consult pending, PT eval, pending. TOC will continue to follow.           Patient Goals and CMS Choice            Discharge Placement                       Discharge Plan and Services Additional resources added to the After Visit Summary for                                       Social Drivers of Health (SDOH) Interventions SDOH Screenings   Food Insecurity: No Food Insecurity (11/09/2023)  Housing: Low Risk  (11/09/2023)  Transportation Needs: No Transportation Needs (11/09/2023)  Utilities: Not At Risk (11/09/2023)  Social Connections: Moderately Integrated (11/09/2023)  Tobacco Use: Low Risk  (11/09/2023)     Readmission Risk Interventions     No data to display

## 2023-11-12 NOTE — Plan of Care (Signed)
  Problem: Pain Managment: Goal: General experience of comfort will improve and/or be controlled Outcome: Progressing   Problem: Safety: Goal: Ability to remain free from injury will improve Outcome: Progressing   Problem: Clinical Measurements: Goal: Ability to avoid or minimize complications of infection will improve Outcome: Progressing   Problem: Skin Integrity: Goal: Skin integrity will improve Outcome: Progressing   Problem: Metabolic: Goal: Ability to maintain appropriate glucose levels will improve Outcome: Progressing

## 2023-11-13 DIAGNOSIS — L03115 Cellulitis of right lower limb: Secondary | ICD-10-CM | POA: Diagnosis not present

## 2023-11-13 LAB — RENAL FUNCTION PANEL
Albumin: 2.2 g/dL — ABNORMAL LOW (ref 3.5–5.0)
Anion gap: 8 (ref 5–15)
BUN: 9 mg/dL (ref 8–23)
CO2: 24 mmol/L (ref 22–32)
Calcium: 7.4 mg/dL — ABNORMAL LOW (ref 8.9–10.3)
Chloride: 106 mmol/L (ref 98–111)
Creatinine, Ser: 0.81 mg/dL (ref 0.44–1.00)
GFR, Estimated: >60 mL/min (ref >60)
Glucose, Bld: 102 mg/dL — ABNORMAL HIGH (ref 70–99)
Phosphorus: 2.8 mg/dL (ref 2.5–4.6)
Potassium: 3.3 mmol/L — ABNORMAL LOW (ref 3.5–5.1)
Sodium: 138 mmol/L (ref 135–145)

## 2023-11-13 LAB — GLUCOSE, CAPILLARY
Glucose-Capillary: 114 mg/dL — ABNORMAL HIGH (ref 70–99)
Glucose-Capillary: 126 mg/dL — ABNORMAL HIGH (ref 70–99)
Glucose-Capillary: 114 mg/dL — ABNORMAL HIGH (ref 70–99)
Glucose-Capillary: 109 mg/dL — ABNORMAL HIGH (ref 70–99)

## 2023-11-13 LAB — CBC
HCT: 33.1 % — ABNORMAL LOW (ref 36.0–46.0)
Hemoglobin: 10.8 g/dL — ABNORMAL LOW (ref 12.0–15.0)
MCH: 28.1 pg (ref 26.0–34.0)
MCHC: 32.6 g/dL (ref 30.0–36.0)
MCV: 86.2 fL (ref 80.0–100.0)
Platelets: 255 K/uL (ref 150–400)
RBC: 3.84 MIL/uL — ABNORMAL LOW (ref 3.87–5.11)
RDW: 14.6 % (ref 11.5–15.5)
WBC: 15 K/uL — ABNORMAL HIGH (ref 4.0–10.5)
nRBC: 0 % (ref 0.0–0.2)

## 2023-11-13 LAB — MAGNESIUM: Magnesium: 1.7 mg/dL (ref 1.7–2.4)

## 2023-11-13 MED ORDER — POTASSIUM CHLORIDE 10 MEQ/100ML IV SOLN
10.0000 meq | INTRAVENOUS | Status: AC
  Administered 2023-11-13 (×3): 10 meq via INTRAVENOUS
  Filled 2023-11-13: qty 100

## 2023-11-13 MED ORDER — FAMOTIDINE IN NACL 20-0.9 MG/50ML-% IV SOLN
20.0000 mg | Freq: Once | INTRAVENOUS | Status: AC
  Administered 2023-11-13: 20 mg via INTRAVENOUS
  Filled 2023-11-13: qty 50

## 2023-11-13 MED ORDER — POTASSIUM CHLORIDE 10 MEQ/100ML IV SOLN
10.0000 meq | INTRAVENOUS | Status: AC
  Administered 2023-11-13 (×2): 10 meq via INTRAVENOUS
  Filled 2023-11-13: qty 100

## 2023-11-13 MED ORDER — DIPHENHYDRAMINE HCL 25 MG PO CAPS
25.0000 mg | ORAL_CAPSULE | Freq: Four times a day (QID) | ORAL | Status: DC | PRN
  Administered 2023-11-13 – 2023-11-17 (×10): 25 mg via ORAL
  Filled 2023-11-13 (×10): qty 1

## 2023-11-13 MED ORDER — DIPHENHYDRAMINE HCL 50 MG/ML IJ SOLN
25.0000 mg | INTRAMUSCULAR | Status: AC
  Administered 2023-11-13: 25 mg via INTRAVENOUS
  Filled 2023-11-13: qty 1

## 2023-11-13 NOTE — Progress Notes (Signed)
 Progress Note   Patient: Claudia Holmes FMW:[id number] DOB: Apr 04, 1956 DOA: 11/09/2023     3 DOS: the patient was seen and examined on 11/13/2023   Brief hospital course: Taken from H&P.  Claudia Holmes is a 68 y.o. female with medical history significant of DM2, HTN HLD, bilateral lower extremity skin graft, DVT/PE, Presented with right lower extremity pain swelling fatigue nausea.  On presentation vital stable, labs with leukocytosis at 16.9, lactic acid 2.3>> 1.0, CK2 049, phosphorous 1.9, potassium 3.2, mildly elevated creatinine but not meeting the criteria for AKI. CT of right lower extremity with no concern of abscess or osteomyelitis.  Subcutaneous soft tissue edema consistent with cellulitis.  Electrolytes were repleted and patient was started on cefepime  and vancomycin .  7/6: Vital and labs stable.  Magnesium  of 1.7 which is being repleted.  MRSA PCR negative, preliminary blood cultures negative.  CK improving.  Continuing IV fluid and IV antibiotics for another day.  7/7: Vital stable, improving CK, bicarb of 21, leukocytosis at 12 today, continuing IV fluid for another day.  7/8: Patient became febrile at 100.2 overnight.  Worsening right lower extremity edema, erythema and now started oozing close to ankle.  Infectious disease was consulted, antibiotic switched with linezolid .  Worsening leukocytosis with mild hypokalemia and improving CK levels.  7/9: Afebrile, right lower extremity swelling improved  Assessment and Plan:  Cellulitis Worsening symptoms and became febrile overnight History of skin graft.  Worsening leukocytosis today MRSA PCR negative, preliminary blood cultures negative. -Consulting ID-Dr. Luiz will see the patient - Antibiotic switched with linezolid  by ID -Continue with supportive care  AKI (acute kidney injury) [facility] Patient with mildly elevated creatinine on admission likely due to dehydration as it quickly improved with IV fluid.  Not meeting the  criteria for AKI so AKI ruled out. Creatinine is now less than 1. - Monitor renal function  Rhabdomyolysis CK elevated above 2000 on admission. Continue to improve, 823 today -Monitor CK  Hypokalemia Potassium at 3.3 today -Continue to monitor and replete as needed  Hypophosphatemia Improved to 2.6 with repletion. - Continue to monitor  DM2 (diabetes mellitus, type 2) (HCC) CBG within goal and A1c of 5.9. - Continue with SSI  History of pulmonary embolism - Continue Eliquis   Mixed hyperlipidemia - Holding home Lipitor due to concern of rhabdomyolysis       Subjective: Complaints of right lower extremity pain  Physical Exam: Vitals:   11/12/23 1320 11/12/23 2140 11/13/23 0600 11/13/23 1315  BP: (!) 126/55 (!) 149/62 (!) 111/44 135/70  Pulse: 76 80 74 82  Resp: 16 18 18 17   Temp: 98.9 F (37.2 C) 98.7 F (37.1 C) 98.2 F (36.8 C) 97.9 F (36.6 C)  TempSrc: Oral Oral Oral   SpO2: 96% 95% 95% 95%  Weight:      Height:       Constitutional: Alert, awake, calm, comfortable HEENT: Neck supple Respiratory: clear to auscultation bilaterally, no wheezing, no crackles. Normal respiratory effort. No accessory muscle use.  Cardiovascular: Regular rate and rhythm, no murmurs / rubs / gallops. No extremity edema. 2+ pedal pulses. No carotid bruits.  Abdomen: no tenderness, no masses palpated. No hepatosplenomegaly. Bowel sounds positive.  Musculoskeletal: Right lower extremity severe swelling, redness and tenderness Skin: no rashes, lesions, ulcers. No induration Neurologic: CN 2-12 grossly intact. Sensation intact, DTR normal. Strength 5/5 x all 4 extremities.  Psychiatric: Normal judgment and insight. Alert and oriented x 3. Normal mood.   Data Reviewed:  Potassium  3.3, WBC 15  Family Communication: Not available  Disposition: Status is: Inpatient Remains inpatient appropriate because: Ongoing cellulitis  Planned Discharge Destination: Home with Home  Health    Time spent: 35 minutes  Author: Nena Rebel, MD 11/13/2023 5:07 PM  For on call review [email or web address].

## 2023-11-13 NOTE — Plan of Care (Signed)
   Problem: Health Behavior/Discharge Planning: Goal: Ability to manage health-related needs will improve Outcome: Progressing   Problem: Clinical Measurements: Goal: Ability to maintain clinical measurements within normal limits will improve Outcome: Progressing Goal: Will remain free from infection Outcome: Progressing Goal: Diagnostic test results will improve Outcome: Progressing Goal: Respiratory complications will improve Outcome: Progressing

## 2023-11-13 NOTE — Progress Notes (Signed)
    [facility]    Date of Admission:  11/09/2023   Total days of antibiotics 5/day 2 linezolid    ID: Claudia Holmes is a 68 y.o. female with right lower leg cellulitis Principal Problem:   Cellulitis Active Problems:   Mixed hyperlipidemia   History of pulmonary embolism   AKI (acute kidney injury) (HCC)   Dehydration   Hypokalemia   DM2 (diabetes mellitus, type 2) (HCC)   Hypophosphatemia   Rhabdomyolysis    Subjective: She is afebrile, first day feeling better has ambulated to the bathroom, only mild discomfort to weight bearing to right lower leg. Still remains swollen, slightly less redness  Medications:   apixaban   2.5 mg Oral BID   ezetimibe   10 mg Oral Daily   insulin  aspart  0-15 Units Subcutaneous TID WC    Objective: Vital signs in last 24 hours: Temp:  [98.2 F (36.8 C)-98.9 F (37.2 C)] 98.2 F (36.8 C) (07/09 0600) Pulse Rate:  [74-80] 74 (07/09 0600) Resp:  [16-18] 18 (07/09 0600) BP: (111-149)/(44-62) 111/44 (07/09 0600) SpO2:  [95 %-96 %] 95 % (07/09 0600)  Physical Exam  Constitutional:  oriented to person, place, and time. appears well-developed and well-nourished. No distress.  HENT: [place]/AT, PERRLA, no scleral icterus Mouth/Throat: Oropharynx is clear and moist. No oropharyngeal exudate.  Cardiovascular: Normal rate, regular rhythm and normal heart sounds. Exam reveals no gallop and no friction rub.  No murmur heard.  Pulmonary/Chest: Effort normal and breath sounds normal. No respiratory distress.  has no wheezes.  Neck = supple, no nuchal rigidity Abdominal: Soft. Bowel sounds are normal.  exhibits no distension. There is no tenderness.  Lymphadenopathy: no cervical adenopathy. No axillary adenopathy Neurological: alert and oriented to person, place, and time.  Skin: right lower leg, erythema improved slightly, still blanching. Swelling unchanged. Serous drainage,dried golden crusting in areas Psychiatric: a normal  mood and affect.  behavior is normal.    Lab Results Recent Labs    11/12/23 0800 11/13/23 0317  WBC 17.7* 15.0*  HGB 12.3 10.8*  HCT 36.4 33.1*  NA 140 138  K 3.3* 3.3*  CL 106 106  CO2 23 24  BUN 9 9  CREATININE 0.87 0.81   Liver Panel Recent Labs    11/11/23 0345 11/13/23 0317  ALBUMIN 2.6* 2.2*   Sedimentation Rate No results for input(s): ESRSEDRATE in the last 72 hours. C-Reactive Protein No results for input(s): CRP in the last 72 hours.  Microbiology: Ngtd on blood cx Studies/Results: No results found.   Assessment/Plan: Right lower leg cellulitis = suspect it is due to staph or strep. Continue on linezolid . If not appreciably improving tomorrow would recommend ct or u/s to evaluate for any pockets of infection/abscess in deep tissue   Continue on linezolid  for now  Leukocytosis = improving  Beta lactam rash to torso and extremities = unchanged, can to do atarax  as needed for itching. Anticipate to take a week to improve. Suspect exposure could be ceftriaxone/cefepime .  [facility] for Infectious Diseases Pager: [phone]  11/13/2023, 11:08 AM

## 2023-11-13 NOTE — Plan of Care (Signed)
  Problem: Health Behavior/Discharge Planning: Goal: Ability to manage health-related needs will improve Outcome: Adequate for Discharge   Problem: Clinical Measurements: Goal: Ability to maintain clinical measurements within normal limits will improve Outcome: Progressing Goal: Will remain free from infection Outcome: Progressing Goal: Diagnostic test results will improve Outcome: Progressing Goal: Respiratory complications will improve Outcome: Progressing   Problem: Activity: Goal: Risk for activity intolerance will decrease Outcome: Adequate for Discharge   Problem: Nutrition: Goal: Adequate nutrition will be maintained Outcome: Adequate for Discharge   Problem: Coping: Goal: Level of anxiety will decrease Outcome: Progressing   Problem: Elimination: Goal: Will not experience complications related to bowel motility Outcome: Completed/Met   Problem: Pain Managment: Goal: General experience of comfort will improve and/or be controlled Outcome: Progressing   Problem: Safety: Goal: Ability to remain free from injury will improve Outcome: Progressing   Problem: Skin Integrity: Goal: Risk for impaired skin integrity will decrease Outcome: Progressing   Problem: Clinical Measurements: Goal: Ability to avoid or minimize complications of infection will improve Outcome: Progressing   Problem: Skin Integrity: Goal: Skin integrity will improve Outcome: Progressing   Problem: Skin Integrity: Goal: Risk for impaired skin integrity will decrease Outcome: Progressing   Problem: Tissue Perfusion: Goal: Adequacy of tissue perfusion will improve Outcome: Progressing

## 2023-11-14 DIAGNOSIS — L03115 Cellulitis of right lower limb: Secondary | ICD-10-CM | POA: Diagnosis not present

## 2023-11-14 LAB — CULTURE, BLOOD (ROUTINE X 2)
Culture: NO GROWTH
Special Requests: ADEQUATE

## 2023-11-14 LAB — COMPREHENSIVE METABOLIC PANEL WITH GFR
ALT: 33 U/L (ref 0–44)
AST: 31 U/L (ref 15–41)
Albumin: 2.4 g/dL — ABNORMAL LOW (ref 3.5–5.0)
Alkaline Phosphatase: 81 U/L (ref 38–126)
Anion gap: 10 (ref 5–15)
BUN: 9 mg/dL (ref 8–23)
CO2: 22 mmol/L (ref 22–32)
Calcium: 7.8 mg/dL — ABNORMAL LOW (ref 8.9–10.3)
Chloride: 107 mmol/L (ref 98–111)
Creatinine, Ser: 0.66 mg/dL (ref 0.44–1.00)
GFR, Estimated: >60 mL/min (ref >60)
Glucose, Bld: 114 mg/dL — ABNORMAL HIGH (ref 70–99)
Potassium: 3.7 mmol/L (ref 3.5–5.1)
Sodium: 139 mmol/L (ref 135–145)
Total Bilirubin: 0.7 mg/dL (ref 0.0–1.2)
Total Protein: 5.5 g/dL — ABNORMAL LOW (ref 6.5–8.1)

## 2023-11-14 LAB — GLUCOSE, CAPILLARY
Glucose-Capillary: 127 mg/dL — ABNORMAL HIGH (ref 70–99)
Glucose-Capillary: 150 mg/dL — ABNORMAL HIGH (ref 70–99)
Glucose-Capillary: 92 mg/dL (ref 70–99)
Glucose-Capillary: 147 mg/dL — ABNORMAL HIGH (ref 70–99)

## 2023-11-14 LAB — CBC
HCT: 34.5 % — ABNORMAL LOW (ref 36.0–46.0)
Hemoglobin: 11 g/dL — ABNORMAL LOW (ref 12.0–15.0)
MCH: 27.7 pg (ref 26.0–34.0)
MCHC: 31.9 g/dL (ref 30.0–36.0)
MCV: 86.9 fL (ref 80.0–100.0)
Platelets: 324 K/uL (ref 150–400)
RBC: 3.97 MIL/uL (ref 3.87–5.11)
RDW: 14.8 % (ref 11.5–15.5)
WBC: 12.4 K/uL — ABNORMAL HIGH (ref 4.0–10.5)
nRBC: 0 % (ref 0.0–0.2)

## 2023-11-14 LAB — MAGNESIUM: Magnesium: 1.8 mg/dL (ref 1.7–2.4)

## 2023-11-14 MED ORDER — FAMOTIDINE 20 MG PO TABS
20.0000 mg | ORAL_TABLET | Freq: Two times a day (BID) | ORAL | Status: DC
  Administered 2023-11-14 – 2023-11-18 (×9): 20 mg via ORAL
  Filled 2023-11-14 (×9): qty 1

## 2023-11-14 MED ORDER — HYDROXYZINE HCL 25 MG PO TABS
25.0000 mg | ORAL_TABLET | Freq: Four times a day (QID) | ORAL | Status: AC | PRN
  Administered 2023-11-14 (×2): 25 mg via ORAL
  Filled 2023-11-14 (×2): qty 1

## 2023-11-14 MED ORDER — HYDROXYZINE HCL 10 MG PO TABS
10.0000 mg | ORAL_TABLET | Freq: Three times a day (TID) | ORAL | Status: DC
  Administered 2023-11-14 – 2023-11-18 (×12): 10 mg via ORAL
  Filled 2023-11-14 (×14): qty 1

## 2023-11-14 NOTE — Plan of Care (Signed)
  Problem: Health Behavior/Discharge Planning: Goal: Ability to manage health-related needs will improve Outcome: Adequate for Discharge   Problem: Clinical Measurements: Goal: Ability to maintain clinical measurements within normal limits will improve Outcome: Progressing Goal: Will remain free from infection Outcome: Progressing Goal: Diagnostic test results will improve Outcome: Progressing Goal: Respiratory complications will improve Outcome: Progressing   Problem: Activity: Goal: Risk for activity intolerance will decrease Outcome: Adequate for Discharge   Problem: Nutrition: Goal: Adequate nutrition will be maintained Outcome: Adequate for Discharge   Problem: Coping: Goal: Level of anxiety will decrease Outcome: Progressing   Problem: Pain Managment: Goal: General experience of comfort will improve and/or be controlled Outcome: Adequate for Discharge   Problem: Safety: Goal: Ability to remain free from injury will improve Outcome: Progressing   Problem: Skin Integrity: Goal: Risk for impaired skin integrity will decrease Outcome: Progressing   Problem: Clinical Measurements: Goal: Ability to avoid or minimize complications of infection will improve Outcome: Progressing   Problem: Skin Integrity: Goal: Skin integrity will improve Outcome: Progressing   Problem: Nutritional: Goal: Maintenance of adequate nutrition will improve Outcome: Adequate for Discharge Goal: Progress toward achieving an optimal weight will improve Outcome: Progressing   Problem: Skin Integrity: Goal: Risk for impaired skin integrity will decrease Outcome: Progressing

## 2023-11-14 NOTE — Progress Notes (Signed)
 Physical Therapy Treatment Patient Details Name: Claudia Holmes MRN: [id number] DOB: Sep 19, 1955 Today's Date: 11/14/2023   History of Present Illness 68 y.o. female with medical history significant of DM2, HTN HLD, bilateral lower extremity skin graft, DVT/PE. Presented with right lower extremity pain swelling fatigue, nausea. admitted with cellulitis R LE, AKI, rhabdomyolysis    PT Comments  PT - Cognition Comments: AxO x 3 pleasant and very motivated. Pt lives home alone with many dogs. Assisted OOB to amb went well.  General Gait Details: walks on toes R foot d/t baseline pf contracture. good stability with RW, no LOB, lightly reliant on UEs.  Pt plans to return home when medically stable.    If plan is discharge home, recommend the following: Help with stairs or ramp for entrance;Assist for transportation   Can travel by private vehicle        Equipment Recommendations  None recommended by PT    Recommendations for Other Services       Precautions / Restrictions Precautions Precautions: Fall Restrictions Weight Bearing Restrictions Per Provider Order: No Other Position/Activity Restrictions: WBAT     Mobility  Bed Mobility Overal bed mobility: Modified Independent             General bed mobility comments: self able    Transfers Overall transfer level: Needs assistance   Transfers: Sit to/from Stand Sit to Stand: Modified independent (Device/Increase time)           General transfer comment: self able    Ambulation/Gait Ambulation/Gait assistance: Supervision Gait Distance (Feet): 225 Feet Assistive device: Rolling walker (2 wheels) Gait Pattern/deviations: Step-through pattern, Decreased stance time - right, Decreased dorsiflexion - left, Decreased dorsiflexion - right Gait velocity: decreased but functional     General Gait Details: walks on toes R foot d/t baseline pf contracture. good stability with RW, no LOB, lightly reliant on  UEs.   Stairs             Wheelchair Mobility     Tilt Bed    Modified Rankin (Stroke Patients Only)       Balance                                            Communication Communication Communication: No apparent difficulties  Cognition Arousal: Alert Behavior During Therapy: WFL for tasks assessed/performed   PT - Cognitive impairments: No apparent impairments                       PT - Cognition Comments: AxO x 3 pleasant and very motivated. Pt lives home alone with many dogs. Following commands: Intact      Cueing Cueing Techniques: Verbal cues  Exercises      General Comments        Pertinent Vitals/Pain Pain Assessment Pain Assessment: Faces Faces Pain Scale: Hurts a little bit Pain Location: RLE Pain Descriptors / Indicators: Tightness Pain Intervention(s): Monitored during session, Repositioned    Home Living                          Prior Function            PT Goals (current goals can now be found in the care plan section) Progress towards PT goals: Progressing toward goals    Frequency    Min 2X/week  PT Plan      Co-evaluation              AM-PAC PT 6 Clicks Mobility   Outcome Measure  Help needed turning from your back to your side while in a flat bed without using bedrails?: None Help needed moving from lying on your back to sitting on the side of a flat bed without using bedrails?: None Help needed moving to and from a bed to a chair (including a wheelchair)?: None Help needed standing up from a chair using your arms (e.g., wheelchair or bedside chair)?: None Help needed to walk in hospital room?: None Help needed climbing 3-5 steps with a railing? : A Little 6 Click Score: 23    End of Session Equipment Utilized During Treatment: Gait belt Activity Tolerance: Patient tolerated treatment well Patient left: in bed;with call bell/phone within reach Nurse  Communication: Mobility status PT Visit Diagnosis: Other abnormalities of gait and mobility (R26.89)     Time: 1215-1226 PT Time Calculation (min) (ACUTE ONLY): 11 min  Charges:    $Gait Training: 8-22 mins PT General Charges $$ ACUTE PT VISIT: 1 Visit                      Katheryn Leap  PTA Acute  Rehabilitation Services Office M-F          [phone]

## 2023-11-14 NOTE — Progress Notes (Signed)
    [facility]    Date of Admission:  11/09/2023   Total days of antibiotics 5   ID: Claudia Holmes is a 68 y.o. female with  Principal Problem:   Cellulitis Active Problems:   Mixed hyperlipidemia   History of pulmonary embolism   AKI (acute kidney injury) (HCC)   Dehydration   Hypokalemia   DM2 (diabetes mellitus, type 2) (HCC)   Hypophosphatemia   Rhabdomyolysis    Subjective: Having pruritic rash to arms and chest evolving.  Ambulated without too much pain on right ankle. No fever. Cellulitis continues to improve on the right lower leg  Medications:   apixaban   2.5 mg Oral BID   ezetimibe   10 mg Oral Daily   famotidine   20 mg Oral BID   hydrOXYzine   10 mg Oral TID   insulin  aspart  0-15 Units Subcutaneous TID WC    Objective: Vital signs in last 24 hours: Temp:  [98 F (36.7 C)-98.9 F (37.2 C)] 98.6 F (37 C) (07/10 1337) Pulse Rate:  [74-84] 81 (07/10 1337) Resp:  [15-18] 17 (07/10 1337) BP: (125-155)/(70-76) 125/70 (07/10 1337) SpO2:  [93 %-98 %] 98 % (07/10 1337)  Physical Exam  Constitutional:  oriented to person, place, and time. appears well-developed and well-nourished. No distress.  HENT: [place]/AT, PERRLA, no scleral icterus Mouth/Throat: Oropharynx is clear and moist. No oropharyngeal exudate.  Cardiovascular: Normal rate, regular rhythm and normal heart sounds. Exam reveals no gallop and no friction rub.  No murmur heard.  Pulmonary/Chest: Effort normal and breath sounds normal. No respiratory distress.  has no wheezes.  Neck = supple, no nuchal rigidity Abdominal: Soft. Bowel sounds are normal.  exhibits no distension. There is no tenderness.  Lymphadenopathy: no cervical adenopathy. No axillary adenopathy Neurological: alert and oriented to person, place, and time.  Skin: Skin is warm and dry. +rash to torso and arms, and right leg- swelling slighlty improved Psychiatric: a normal mood and affect.  behavior is normal.    Lab Results Recent Labs    11/13/23 0317 11/14/23 0415  WBC 15.0* 12.4*  HGB 10.8* 11.0*  HCT 33.1* 34.5*  NA 138 139  K 3.3* 3.7  CL 106 107  CO2 24 22  BUN 9 9  CREATININE 0.81 0.66   Liver Panel Recent Labs    11/13/23 0317 11/14/23 0415  PROT  --  5.5*  ALBUMIN 2.2* 2.4*  AST  --  31  ALT  --  33  ALKPHOS  --  81  BILITOT  --  0.7   Sedimentation Rate No results for input(s): ESRSEDRATE in the last 72 hours. C-Reactive Protein No results for input(s): CRP in the last 72 hours.  Microbiology: reviewed Studies/Results: No results found.   Assessment/Plan: Cellulitis = continue with linezolid   Leukocytosis = continues to improve  Beta lactam rash = will likely last for 7 days. Have stopped offending agent. Can treat symptoms with h2 blocker and hydroxyzine  to help with itching.  [facility] Pager: [phone]  11/14/2023, 4:23 PM

## 2023-11-14 NOTE — Plan of Care (Signed)
  Problem: Health Behavior/Discharge Planning: Goal: Ability to manage health-related needs will improve Outcome: Adequate for Discharge   Problem: Clinical Measurements: Goal: Ability to maintain clinical measurements within normal limits will improve Outcome: Progressing Goal: Will remain free from infection Outcome: Progressing Goal: Diagnostic test results will improve Outcome: Progressing Goal: Respiratory complications will improve Outcome: Progressing   Problem: Activity: Goal: Risk for activity intolerance will decrease Outcome: Adequate for Discharge   Problem: Nutrition: Goal: Adequate nutrition will be maintained Outcome: Progressing   Problem: Coping: Goal: Level of anxiety will decrease Outcome: Progressing   Problem: Pain Managment: Goal: General experience of comfort will improve and/or be controlled Outcome: Progressing   Problem: Safety: Goal: Ability to remain free from injury will improve Outcome: Progressing   Problem: Skin Integrity: Goal: Risk for impaired skin integrity will decrease Outcome: Progressing   Problem: Clinical Measurements: Goal: Ability to avoid or minimize complications of infection will improve Outcome: Progressing   Problem: Skin Integrity: Goal: Skin integrity will improve Outcome: Progressing   Problem: Nutritional: Goal: Maintenance of adequate nutrition will improve Outcome: Adequate for Discharge Goal: Progress toward achieving an optimal weight will improve Outcome: Progressing   Problem: Skin Integrity: Goal: Risk for impaired skin integrity will decrease Outcome: Progressing   Problem: Tissue Perfusion: Goal: Adequacy of tissue perfusion will improve Outcome: Progressing

## 2023-11-14 NOTE — Plan of Care (Signed)
  Problem: Health Behavior/Discharge Planning: Goal: Ability to manage health-related needs will improve Outcome: Progressing   Problem: Clinical Measurements: Goal: Ability to maintain clinical measurements within normal limits will improve Outcome: Progressing Goal: Diagnostic test results will improve Outcome: Progressing   Problem: Activity: Goal: Risk for activity intolerance will decrease Outcome: Progressing   Problem: Coping: Goal: Level of anxiety will decrease Outcome: Progressing   Problem: Pain Managment: Goal: General experience of comfort will improve and/or be controlled Outcome: Progressing

## 2023-11-14 NOTE — Progress Notes (Signed)
 Progress Note   Patient: Claudia Holmes FMW:[id number] DOB: 22-Oct-1955 DOA: 11/09/2023     4 DOS: the patient was seen and examined on 11/14/2023   Brief hospital course: Taken from H&P.  JESSIE COWHER is a 68 y.o. female with medical history significant of DM2, HTN HLD, bilateral lower extremity skin graft, DVT/PE, Presented with right lower extremity pain swelling fatigue nausea.  On presentation vital stable, labs with leukocytosis at 16.9, lactic acid 2.3>> 1.0, CK2 049, phosphorous 1.9, potassium 3.2, mildly elevated creatinine but not meeting the criteria for AKI. CT of right lower extremity with no concern of abscess or osteomyelitis.  Subcutaneous soft tissue edema consistent with cellulitis.  Electrolytes were repleted and patient was started on cefepime  and vancomycin .  7/6: Vital and labs stable.  Magnesium  of 1.7 which is being repleted.  MRSA PCR negative, preliminary blood cultures negative.  CK improving.  Continuing IV fluid and IV antibiotics for another day.  7/7: Vital stable, improving CK, bicarb of 21, leukocytosis at 12 today, continuing IV fluid for another day.  7/8: Patient became febrile at 100.2 overnight.  Worsening right lower extremity edema, erythema and now started oozing close to ankle.  Infectious disease was consulted, antibiotic switched with linezolid .  Worsening leukocytosis with mild hypokalemia and improving CK levels.  7/9: Afebrile, right lower extremity swelling improved  7/10: Afebrile improving right lower extremity swelling, patient feels better.  Assessment and Plan: Cellulitis in the setting of skin graft that extremity -Symptoms are stable today. -History of skin graft. -Leukocytosis has improved almost to normal -MRSA PCR negative, preliminary blood cultures negative. -ID consult by Dr. Juli appreciated -Antibiotic switched with linezolid  by ID -Continue with supportive care   AKI (acute kidney injury) [facility]) Patient with mildly  elevated creatinine on admission likely due to dehydration as it quickly improved with IV fluid.  Not meeting the criteria for AKI so AKI ruled out. Creatinine is now less than 1. - Monitor renal function   Rhabdomyolysis CK elevated above 2000 on admission. Continue to improve, 823 today -Monitor CK   Hypokalemia Potassium at 3.3 today -Continue to monitor and replete as needed   Hypophosphatemia Improved to 2.6 with repletion. - Continue to monitor   DM2 (diabetes mellitus, type 2) (HCC) CBG within goal and A1c of 5.9. - Continue with SSI   History of pulmonary embolism - Continue Eliquis    Mixed hyperlipidemia - Holding home Lipitor due to concern of rhabdomyolysis     Subjective: Still complains of right lower extremity pain but says it decreased in intensity.  Physical Exam: Vitals:   11/13/23 1738 11/13/23 2009 11/14/23 0600 11/14/23 1337  BP: (!) 147/70 (!) 155/76 (!) 140/71 125/70  Pulse: 74 84 81 81  Resp: 16 18 15 17   Temp: 98.5 F (36.9 C) 98 F (36.7 C) 98.9 F (37.2 C) 98.6 F (37 C)  TempSrc: Oral     SpO2: 96% 93% 93% 98%  Weight:      Height:       Constitutional: Alert, awake, calm, comfortable HEENT: Neck supple Respiratory: clear to auscultation bilaterally, no wheezing, no crackles. Normal respiratory effort. No accessory muscle use.  Cardiovascular: Regular rate and rhythm, no murmurs / rubs / gallops. No extremity edema. 2+ pedal pulses. No carotid bruits.  Abdomen: no tenderness, no masses palpated. No hepatosplenomegaly. Bowel sounds positive.  Musculoskeletal: Right lower extremity has swelling and erythema but has been significantly decreasing. Skin: no rashes, lesions, ulcers. No induration Neurologic: CN  2-12 grossly intact. Sensation intact, DTR normal. Strength 5/5 x all 4 extremities.  Psychiatric: Normal judgment and insight. Alert and oriented x 3. Normal mood.   Data Reviewed:  White count improved to 12.4, potassium 3.7,  BUN 9 creatinine 0.66  Family Communication: No family member was available  Disposition: Status is: Inpatient Remains inpatient appropriate because: Ongoing infection and cellulitis of right lower extremity  Planned Discharge Destination: Home with Home Health    Time spent: 35 minutes  Author: Nena Rebel, MD 11/14/2023 4:28 PM  For on call review [email or web address].

## 2023-11-15 ENCOUNTER — Other Ambulatory Visit (HOSPITAL_COMMUNITY): Payer: Self-pay

## 2023-11-15 ENCOUNTER — Telehealth (HOSPITAL_COMMUNITY): Payer: Self-pay | Admitting: Pharmacy Technician

## 2023-11-15 DIAGNOSIS — L03115 Cellulitis of right lower limb: Secondary | ICD-10-CM | POA: Diagnosis not present

## 2023-11-15 LAB — BASIC METABOLIC PANEL WITH GFR
Anion gap: 8 (ref 5–15)
BUN: 8 mg/dL (ref 8–23)
CO2: 23 mmol/L (ref 22–32)
Calcium: 7.6 mg/dL — ABNORMAL LOW (ref 8.9–10.3)
Chloride: 106 mmol/L (ref 98–111)
Creatinine, Ser: 0.77 mg/dL (ref 0.44–1.00)
GFR, Estimated: >60 mL/min (ref >60)
Glucose, Bld: 118 mg/dL — ABNORMAL HIGH (ref 70–99)
Potassium: 3.5 mmol/L (ref 3.5–5.1)
Sodium: 137 mmol/L (ref 135–145)

## 2023-11-15 LAB — CBC
HCT: 38.3 % (ref 36.0–46.0)
Hemoglobin: 12.4 g/dL (ref 12.0–15.0)
MCH: 28.2 pg (ref 26.0–34.0)
MCHC: 32.4 g/dL (ref 30.0–36.0)
MCV: 87 fL (ref 80.0–100.0)
Platelets: 444 K/uL — ABNORMAL HIGH (ref 150–400)
RBC: 4.4 MIL/uL (ref 3.87–5.11)
RDW: 15.1 % (ref 11.5–15.5)
WBC: 16 K/uL — ABNORMAL HIGH (ref 4.0–10.5)
nRBC: 0 % (ref 0.0–0.2)

## 2023-11-15 LAB — GLUCOSE, CAPILLARY
Glucose-Capillary: 125 mg/dL — ABNORMAL HIGH (ref 70–99)
Glucose-Capillary: 112 mg/dL — ABNORMAL HIGH (ref 70–99)
Glucose-Capillary: 114 mg/dL — ABNORMAL HIGH (ref 70–99)
Glucose-Capillary: 133 mg/dL — ABNORMAL HIGH (ref 70–99)

## 2023-11-15 LAB — CULTURE, BLOOD (ROUTINE X 2): Culture: NO GROWTH

## 2023-11-15 LAB — MAGNESIUM: Magnesium: 1.9 mg/dL (ref 1.7–2.4)

## 2023-11-15 NOTE — Progress Notes (Signed)
 [facility]    Date of Admission:  11/09/2023   Total days of antibiotics 6   ID: Claudia Holmes is a 68 y.o. female with right lower leg cellulitis complicated by diffuse whole body beta lactam rash Principal Problem:   Cellulitis Active Problems:   Mixed hyperlipidemia   History of pulmonary embolism   AKI (acute kidney injury) (HCC)   Dehydration   Hypokalemia   DM2 (diabetes mellitus, type 2) (HCC)   Hypophosphatemia   Rhabdomyolysis    Subjective: Afebrile, itching from body rash tolerable, has some increased tenderness to right calf; erythema to right leg is unchanged. Not improving from 2 days ago.  Medications:   apixaban   2.5 mg Oral BID   ezetimibe   10 mg Oral Daily   famotidine   20 mg Oral BID   hydrOXYzine   10 mg Oral TID   insulin  aspart  0-15 Units Subcutaneous TID WC    Objective: Vital signs in last 24 hours: Temp:  [97.6 F (36.4 C)-98.7 F (37.1 C)] 98.4 F (36.9 C) (07/11 1028) Pulse Rate:  [81-88] 88 (07/11 1028) Resp:  [14-18] 17 (07/11 1028) BP: (133-144)/(56-78) 133/78 (07/11 1028) SpO2:  [93 %-97 %] 93 % (07/11 1028)  Physical Exam  Constitutional:  oriented to person, place, and time. appears well-developed and well-nourished. No distress.  HENT: [place]/AT, PERRLA, no scleral icterus Mouth/Throat: Oropharynx is clear and moist. No oropharyngeal exudate.  Cardiovascular: Normal rate, regular rhythm and normal heart sounds. Exam reveals no gallop and no friction rub.  No murmur heard.  Pulmonary/Chest: Effort normal and breath sounds normal. No respiratory distress.  has no wheezes.  Neck = supple, no nuchal rigidity Ext: fine macular rash on back improved but torso, arms and left leg more expansive.   Yesterday right leg cellulitis had islands of improvement but today appears to be like it was on Wednesday  Neurological: alert and oriented to person, place, and time.  Skin: Skin is warm and dry. No rash noted.  No erythema.  Psychiatric: a normal mood and affect.  behavior is normal.    Lab Results Recent Labs    11/14/23 0415 11/15/23 0524  WBC 12.4* 16.0*  HGB 11.0* 12.4  HCT 34.5* 38.3  NA 139 137  K 3.7 3.5  CL 107 106  CO2 22 23  BUN 9 8  CREATININE 0.66 0.77   Liver Panel Recent Labs    11/13/23 0317 11/14/23 0415  PROT  --  5.5*  ALBUMIN 2.2* 2.4*  AST  --  31  ALT  --  33  ALKPHOS  --  81  BILITOT  --  0.7   Sedimentation Rate No results for input(s): ESRSEDRATE in the last 72 hours. C-Reactive Protein No results for input(s): CRP in the last 72 hours.  Microbiology: Blood cx ngtd Studies/Results: No results found.   Assessment/Plan: Right leg cellulitis = she reports localized tenderness to the calf, in the setting of leg not improving-- recommend CT to see if any abscess that need to be drained  Diffuse body/extremity beta lactam rash = continue on hydroxyzine  and h2 blocker to minimize symptoms  Leukocytosis and thrombocytosis= slightly worsened, could be response to abtx rash vs. Infection  Continue with standard precautions evaluation of this patient requires complex antimicrobial therapy evaluation and counseling and isolation needs for disease transmission risk assessment and mitigation.    [facility] for Infectious Diseases Pager: [phone]  11/15/2023, 2:05 PM

## 2023-11-15 NOTE — Plan of Care (Signed)
  Problem: Coping: Goal: Level of anxiety will decrease 11/15/2023 2348 by Trudy Maus, RN Outcome: Progressing 11/15/2023 2347 by Trudy Maus, RN Outcome: Progressing   Problem: Pain Managment: Goal: General experience of comfort will improve and/or be controlled 11/15/2023 2348 by Trudy Maus, RN Outcome: Progressing 11/15/2023 2347 by Trudy Maus, RN Outcome: Progressing   Problem: Safety: Goal: Ability to remain free from injury will improve 11/15/2023 2348 by Trudy Maus, RN Outcome: Progressing 11/15/2023 2347 by Trudy Maus, RN Outcome: Progressing

## 2023-11-15 NOTE — Progress Notes (Signed)
 Physical Therapy Treatment Patient Details Name: Claudia Holmes MRN: [id number] DOB: 04/05/1956 Today's Date: 11/15/2023   History of Present Illness 68 y.o. female with medical history significant of DM2, HTN HLD, bilateral lower extremity skin graft, DVT/PE. Presented with right lower extremity pain swelling fatigue, nausea. admitted with cellulitis R LE, AKI, rhabdomyolysis    PT Comments  The patient is eager to ambulate, x 300' using her RW. Continue mobility.    If plan is discharge home, recommend the following: Help with stairs or ramp for entrance;Assist for transportation   Can travel by private vehicle        Equipment Recommendations  None recommended by PT    Recommendations for Other Services       Precautions / Restrictions Precautions Precautions: Fall Precaution/Restrictions Comments: LE skin grafts     Mobility  Bed Mobility Overal bed mobility: Modified Independent                  Transfers Overall transfer level: Modified independent     Sit to Stand: Modified independent (Device/Increase time)                Ambulation/Gait Ambulation/Gait assistance: Supervision Gait Distance (Feet): 300 Feet Assistive device: Rolling walker (2 wheels) Gait Pattern/deviations: Step-through pattern, Decreased stance time - right, Decreased dorsiflexion - left, Decreased dorsiflexion - right Gait velocity: decreased but functional     General Gait Details: walks on toes R foot d/t baseline pf contracture. good stability with RW, no LOB, lightly reliant on UEs.   Stairs             Wheelchair Mobility     Tilt Bed    Modified Rankin (Stroke Patients Only)       Balance Overall balance assessment: Mild deficits observed, not formally tested                                          Communication    Cognition Arousal: Alert Behavior During Therapy: WFL for tasks assessed/performed   PT - Cognitive  impairments: No apparent impairments                                Cueing    Exercises      General Comments        Pertinent Vitals/Pain Pain Assessment Pain Assessment: No/denies pain    Home Living                          Prior Function            PT Goals (current goals can now be found in the care plan section) Progress towards PT goals: Progressing toward goals    Frequency    Min 2X/week      PT Plan      Co-evaluation              AM-PAC PT 6 Clicks Mobility   Outcome Measure  Help needed turning from your back to your side while in a flat bed without using bedrails?: None Help needed moving from lying on your back to sitting on the side of a flat bed without using bedrails?: None Help needed moving to and from a bed to a chair (including a wheelchair)?: None Help needed standing up  from a chair using your arms (e.g., wheelchair or bedside chair)?: None Help needed to walk in hospital room?: None Help needed climbing 3-5 steps with a railing? : A Little 6 Click Score: 23    End of Session Equipment Utilized During Treatment: Gait belt Activity Tolerance: Patient tolerated treatment well Patient left: in bed;with call bell/phone within reach Nurse Communication: Mobility status PT Visit Diagnosis: Other abnormalities of gait and mobility (R26.89)     Time: 9082-9061 PT Time Calculation (min) (ACUTE ONLY): 21 min  Charges:    $Gait Training: 8-22 mins PT General Charges $$ ACUTE PT VISIT: 1 Visit                     Darice Potters PT Acute Rehabilitation Services Office [phone]    Potters Darice Norris 11/15/2023, 11:06 AM

## 2023-11-15 NOTE — Progress Notes (Addendum)
 Progress Note   Patient: Claudia Holmes FMW:[id number] DOB: 12-17-1955 DOA: 11/09/2023     5 DOS: the patient was seen and examined on 11/15/2023   Brief hospital course: Taken from H&P.  NIJA KOOPMAN is a 68 y.o. female with medical history significant of DM2, HTN HLD, bilateral lower extremity skin graft, DVT/PE, Presented with right lower extremity pain swelling fatigue nausea.  On presentation vital stable, labs with leukocytosis at 16.9, lactic acid 2.3>> 1.0, CK2 049, phosphorous 1.9, potassium 3.2, mildly elevated creatinine but not meeting the criteria for AKI. CT of right lower extremity with no concern of abscess or osteomyelitis.  Subcutaneous soft tissue edema consistent with cellulitis.  Electrolytes were repleted and patient was started on cefepime  and vancomycin .  7/6: Vital and labs stable.  Magnesium  of 1.7 which is being repleted.  MRSA PCR negative, preliminary blood cultures negative.  CK improving.  Continuing IV fluid and IV antibiotics for another day.  7/7: Vital stable, improving CK, bicarb of 21, leukocytosis at 12 today, continuing IV fluid for another day.  7/8: Patient became febrile at 100.2 overnight.  Worsening right lower extremity edema, erythema and now started oozing close to ankle.  Infectious disease was consulted, antibiotic switched with linezolid .  Worsening leukocytosis with mild hypokalemia and improving CK levels.  7/9: Afebrile, right lower extremity swelling improved  7/10: Afebrile improving right lower extremity swelling, patient feels better.  Assessment and Plan: Cellulitis in the setting of skin graft that extremity -Symptoms are stable today.  White count is slightly up to 16 from 12.4 today 7/11.  No fever no pain.  Discussed with Dr. Juli, who is concerned about tenderness around the calf and possibly going for imaging today. -History of skin graft. -Leukocytosis has improved almost to normal -MRSA PCR negative, preliminary blood  cultures negative. -ID consult by Dr. Juli appreciated -Antibiotic switched with linezolid  by ID, follow recommendation from ID. -Continue with supportive care   AKI (acute kidney injury) [facility]) Patient with mildly elevated creatinine on admission likely due to dehydration as it quickly improved with IV fluid.  Not meeting the criteria for AKI so AKI ruled out. Creatinine is now less than 1. - Monitor renal function   Rhabdomyolysis CK elevated above 2000 on admission. Continue to improve, 823 today -Monitor CK   Hypokalemia Potassium at 3.3 today -Continue to monitor and replete as needed   Hypophosphatemia Improved to 2.6 with repletion. - Continue to monitor   DM2 (diabetes mellitus, type 2) (HCC) CBG within goal and A1c of 5.9. - Continue with SSI   History of pulmonary embolism - Continue Eliquis    Mixed hyperlipidemia - Holding home Lipitor due to concern of rhabdomyolysis        Subjective: No fever or pain on the lower extremity  Physical Exam: Vitals:   11/14/23 1337 11/14/23 2006 11/15/23 0619 11/15/23 1028  BP: 125/70 (!) 134/56 (!) 144/63 133/78  Pulse: 81 87 81 88  Resp: 17 18 14 17   Temp: 98.6 F (37 C) 97.6 F (36.4 C) 98.7 F (37.1 C) 98.4 F (36.9 C)  TempSrc:  Oral Oral Oral  SpO2: 98% 93% 97% 93%  Weight:      Height:       Constitutional: Alert, awake, calm, comfortable HEENT: Neck supple Respiratory: clear to auscultation bilaterally, no wheezing, no crackles. Normal respiratory effort. No accessory muscle use.  Cardiovascular: Regular rate and rhythm, no murmurs / rubs / gallops. No extremity edema. 2+ pedal pulses. No  carotid bruits.  Abdomen: no tenderness, no masses palpated. No hepatosplenomegaly. Bowel sounds positive.  Musculoskeletal: Right lower extremity cellulitis is slightly improving but ankle swelling is still there Skin: no rashes, lesions, ulcers. No induration Neurologic: CN 2-12 grossly intact. Sensation intact, DTR  normal. Strength 5/5 x all 4 extremities.  Psychiatric: Normal judgment and insight. Alert and oriented x 3. Normal mood.   Data Reviewed:  WBC 16,000 from 12.4  Family Communication: None  Disposition: Status is: Inpatient Remains inpatient appropriate because: Ongoing recovery from right lower extremity cellulitis  Planned Discharge Destination: Home with Home Health    Time spent: 35 minutes  Author: Nena Rebel, MD 11/15/2023 1:41 PM  For on call review [email or web address].

## 2023-11-15 NOTE — Progress Notes (Signed)
 Updated patient's allergy list to include ceftriaxone/cefepime  and placed red allergy bracelet on patient.

## 2023-11-15 NOTE — Telephone Encounter (Signed)
 Patient Product/process development scientist completed.    The patient is insured through [employer]. Patient has Medicare and is not eligible for a copay card, but may be able to apply for patient assistance or Medicare RX Payment Plan (Patient Must reach out to their plan, if eligible for payment plan), if available.    Ran test claim for linezolid  600 mg and the current 14 day co-pay is $0.00.   This test claim was processed through [facility]- copay amounts may vary at other pharmacies due to pharmacy/plan contracts, or as the patient moves through the different stages of their insurance plan.     Reyes Sharps, CPHT Pharmacy Technician III Certified Patient Advocate [facility] Patient Advocate Team Direct Number: [phone]  Fax: [phone]

## 2023-11-15 NOTE — Plan of Care (Signed)
   Problem: Coping: Goal: Level of anxiety will decrease Outcome: Progressing   Problem: Pain Managment: Goal: General experience of comfort will improve and/or be controlled Outcome: Progressing   Problem: Safety: Goal: Ability to remain free from injury will improve Outcome: Progressing

## 2023-11-15 NOTE — Plan of Care (Signed)
  Problem: Health Behavior/Discharge Planning: Goal: Ability to manage health-related needs will improve Outcome: Progressing   Problem: Activity: Goal: Risk for activity intolerance will decrease Outcome: Progressing   Problem: Nutrition: Goal: Adequate nutrition will be maintained Outcome: Progressing   Problem: Coping: Goal: Level of anxiety will decrease Outcome: Progressing   Problem: Pain Managment: Goal: General experience of comfort will improve and/or be controlled Outcome: Progressing

## 2023-11-16 ENCOUNTER — Inpatient Hospital Stay (HOSPITAL_COMMUNITY)

## 2023-11-16 DIAGNOSIS — L03115 Cellulitis of right lower limb: Secondary | ICD-10-CM | POA: Diagnosis not present

## 2023-11-16 DIAGNOSIS — Z794 Long term (current) use of insulin: Secondary | ICD-10-CM

## 2023-11-16 DIAGNOSIS — D72829 Elevated white blood cell count, unspecified: Secondary | ICD-10-CM

## 2023-11-16 DIAGNOSIS — D75839 Thrombocytosis, unspecified: Secondary | ICD-10-CM

## 2023-11-16 DIAGNOSIS — L539 Erythematous condition, unspecified: Secondary | ICD-10-CM | POA: Diagnosis not present

## 2023-11-16 LAB — GLUCOSE, CAPILLARY
Glucose-Capillary: 130 mg/dL — ABNORMAL HIGH (ref 70–99)
Glucose-Capillary: 139 mg/dL — ABNORMAL HIGH (ref 70–99)
Glucose-Capillary: 125 mg/dL — ABNORMAL HIGH (ref 70–99)
Glucose-Capillary: 119 mg/dL — ABNORMAL HIGH (ref 70–99)

## 2023-11-16 LAB — CBC
HCT: 39.3 % (ref 36.0–46.0)
Hemoglobin: 12.5 g/dL (ref 12.0–15.0)
MCH: 28.2 pg (ref 26.0–34.0)
MCHC: 31.8 g/dL (ref 30.0–36.0)
MCV: 88.7 fL (ref 80.0–100.0)
Platelets: 444 K/uL — ABNORMAL HIGH (ref 150–400)
RBC: 4.43 MIL/uL (ref 3.87–5.11)
RDW: 15 % (ref 11.5–15.5)
WBC: 15.2 K/uL — ABNORMAL HIGH (ref 4.0–10.5)
nRBC: 0 % (ref 0.0–0.2)

## 2023-11-16 LAB — BASIC METABOLIC PANEL WITH GFR
Anion gap: 12 (ref 5–15)
BUN: 9 mg/dL (ref 8–23)
CO2: 22 mmol/L (ref 22–32)
Calcium: 7.8 mg/dL — ABNORMAL LOW (ref 8.9–10.3)
Chloride: 103 mmol/L (ref 98–111)
Creatinine, Ser: 0.7 mg/dL (ref 0.44–1.00)
GFR, Estimated: >60 mL/min (ref >60)
Glucose, Bld: 184 mg/dL — ABNORMAL HIGH (ref 70–99)
Potassium: 3.4 mmol/L — ABNORMAL LOW (ref 3.5–5.1)
Sodium: 137 mmol/L (ref 135–145)

## 2023-11-16 LAB — MAGNESIUM: Magnesium: 2 mg/dL (ref 1.7–2.4)

## 2023-11-16 MED ORDER — IOHEXOL 300 MG/ML  SOLN
80.0000 mL | Freq: Once | INTRAMUSCULAR | Status: AC | PRN
  Administered 2023-11-16: 80 mL via INTRAVENOUS

## 2023-11-16 NOTE — Progress Notes (Signed)
 Mobility Specialist - Progress Note   11/16/23 0957  Mobility  Activity Ambulated with assistance in hallway  Level of Assistance Modified independent, requires aide device or extra time  Assistive Device Front wheel walker  Distance Ambulated (ft) 350 ft  Activity Response Tolerated well  Mobility Referral Yes  Mobility visit 1 Mobility  Mobility Specialist Start Time (ACUTE ONLY) 0941  Mobility Specialist Stop Time (ACUTE ONLY) 0954  Mobility Specialist Time Calculation (min) (ACUTE ONLY) 13 min   Pt received in bed and agreeable to mobility. No complaints during session. Pt to recliner after session with all needs met.    [facility]

## 2023-11-16 NOTE — Progress Notes (Signed)
 PROGRESS NOTE  Claudia Holmes FMW:[id number] DOB: 1955/08/14 DOA: 11/09/2023 PCP: Yolande Toribio MATSU, MD   LOS: 6 days   Brief narrative:  Claudia Holmes is a 68 y.o. female with past medical history of diabetes mellitus type 2, hypertension, hyperlipidemia, bilateral lower extremity skin graft, DVT/PE presented hospital with right lower extremity pain, swelling, fatigue nausea..  On initial presentation vitals were stable.  Labs showed leukocytosis at 16.9, lactic acid 2.3>> 1.0, CK 2049, phosphorous 1.9, potassium 3.2, mildly elevated creatinine but not meeting the criteria for AKI. CT scan of right lower extremity with no concern of abscess or osteomyelitis.  Subcutaneous soft tissue edema consistent with cellulitis.  Patient was then admitted to the hospital for further evaluation and treatment   Assessment/Plan: Principal Problem:   Cellulitis Active Problems:   AKI (acute kidney injury) (HCC)   Rhabdomyolysis   Hypokalemia   Hypophosphatemia   DM2 (diabetes mellitus, type 2) (HCC)   History of pulmonary embolism   Mixed hyperlipidemia   Dehydration  Cellulitis in the setting of history of skin graft Venous stasis ulcers Afebrile but leukocytosis.  ID on board and plans for CT scan of the right lower extremity. MRSA PCR negative, blood cultures negative in 5 days.  Currently on linezolid .  Follow ID recommendations.   Elevated creatinine. AKI ruled out.  Latest creatinine of 0.7.   Rhabdomyolysis CK elevated above 2000 on admission.  Improved CK levels.  Check CK levels in AM.  Hypokalemia Replenished BMP pending.   Hypophosphatemia Replenished and improved-check levels in a.m.   DM2 (diabetes mellitus, type 2) CBG within goal and A1c of 5.9. Continue sliding scale insulin .   History of pulmonary embolism Continue Eliquis    Mixed hyperlipidemia Lipitor on hold due to rhabdomyolysis.   Class III obesity.Body mass index is 41.2 kg/m.  Would benefit from  lifestyle modification and weight loss as outpatient.  DVT prophylaxis: apixaban  (ELIQUIS ) tablet 2.5 mg Start: 11/09/23 2345 apixaban  (ELIQUIS ) tablet 2.5 mg   Disposition: Likely home with home health in 1 to 2 days  Status is: Inpatient Remains inpatient appropriate because: IV antibiotics, pending clinical improvement, imaging of the extremity    Code Status:     Code Status: Full Code  Family Communication: none   Consultants: Infectious disease  Procedures: None yet  Anti-infectives:  Linezolid   Anti-infectives (From admission, onward)    Start     Dose/Rate Route Frequency Ordered Stop   11/12/23 2000  linezolid  (ZYVOX ) IVPB 600 mg        600 mg 300 mL/hr over 60 Minutes Intravenous Every 12 hours 11/12/23 1400     11/11/23 2200  vancomycin  (VANCOREADY) IVPB 1500 mg/300 mL  Status:  Discontinued        1,500 mg 150 mL/hr over 120 Minutes Intravenous Daily at bedtime 11/11/23 0739 11/12/23 1400   11/11/23 0000  vancomycin  (VANCOCIN ) IVPB 1000 mg/200 mL premix  Status:  Discontinued        1,000 mg 200 mL/hr over 60 Minutes Intravenous Every 24 hours 11/10/23 0024 11/10/23 0820   11/10/23 0900  ceFEPIme  (MAXIPIME ) 2 g in sodium chloride  0.9 % 100 mL IVPB  Status:  Discontinued        2 g 200 mL/hr over 30 Minutes Intravenous Every 8 hours 11/10/23 0822 11/12/23 1400   11/10/23 0819  vancomycin  (VANCOREADY) IVPB 1250 mg/250 mL  Status:  Discontinued        1,250 mg 166.7 mL/hr over 90 Minutes Intravenous Daily  at bedtime 11/10/23 0820 11/11/23 0739   11/10/23 0000  ceFEPIme  (MAXIPIME ) 2 g in sodium chloride  0.9 % 100 mL IVPB  Status:  Discontinued        2 g 200 mL/hr over 30 Minutes Intravenous Every 12 hours 11/09/23 2302 11/10/23 0822   11/10/23 0000  vancomycin  (VANCOREADY) IVPB 2000 mg/400 mL        2,000 mg 200 mL/hr over 120 Minutes Intravenous  Once 11/09/23 2302 11/10/23 0218   11/10/23 0000  vancomycin  (VANCOCIN ) IVPB 1000 mg/200 mL premix  Status:   Discontinued        1,000 mg 200 mL/hr over 60 Minutes Intravenous Every 24 hours 11/09/23 2308 11/10/23 0024       Subjective: Today, patient was seen and examined at bedside.  Patient denies any nausea vomiting fever chills or rigor.  Complains of mild pain at the right calf area  Objective: Vitals:   11/15/23 2204 11/16/23 0547  BP: (!) 142/72 (!) 136/56  Pulse: 86 74  Resp: 15 15  Temp: 98.9 F (37.2 C) 98.2 F (36.8 C)  SpO2: 94% 95%    Intake/Output Summary (Last 24 hours) at 11/16/2023 1047 Last data filed at 11/16/2023 0815 Gross per 24 hour  Intake 900 ml  Output 200 ml  Net 700 ml   Filed Weights   11/09/23 1432  Weight: 108.9 kg   Body mass index is 41.2 kg/m.   Physical Exam: GENERAL: Patient is alert awake and oriented. Not in obvious distress.  Obese built HENT: No scleral pallor or icterus. Pupils equally reactive to light. Oral mucosa is moist NECK: is supple, no gross swelling noted. CHEST: Clear to auscultation. No crackles or wheezes.  Diminished breath sounds bilaterally. CVS: S1 and S2 heard, no murmur. Regular rate and rhythm.  ABDOMEN: Soft, non-tender, bowel sounds are present. EXTREMITIES: Bilateral lower extremity with erythema deformity and area of focal tenderness over the right calf. CNS: Cranial nerves are intact. No focal motor deficits. SKIN: warm and dry, bilateral lower extremity venous stasis with right lower extremity ulcers   Data Review: I have personally reviewed the following laboratory data and studies,  CBC: Recent Labs  Lab 11/09/23 1711 11/10/23 0141 11/11/23 0345 11/12/23 0800 11/13/23 0317 11/14/23 0415 11/15/23 0524  WBC 16.9*   < > 12.0* 17.7* 15.0* 12.4* 16.0*  NEUTROABS 12.7*  --   --   --   --   --   --   HGB 13.5   < > 11.3* 12.3 10.8* 11.0* 12.4  HCT 40.3   < > 34.9* 36.4 33.1* 34.5* 38.3  MCV 84.5   < > 86.4 85.6 86.2 86.9 87.0  PLT 211   < > 191 276 255 324 444*   < > = values in this interval not  displayed.   Basic Metabolic Panel: Recent Labs  Lab 11/09/23 2208 11/10/23 0441 11/11/23 0345 11/12/23 0800 11/13/23 0317 11/14/23 0415 11/15/23 0524  NA 135 137 135 140 138 139 137  K 3.2* 3.7 3.5 3.3* 3.3* 3.7 3.5  CL 103 105 105 106 106 107 106  CO2 22 22 21* 23 24 22 23   GLUCOSE 208* 121* 115* 140* 102* 114* 118*  BUN 16 14 12 9 9 9 8   CREATININE 1.05* 0.92 0.84 0.87 0.81 0.66 0.77  CALCIUM  8.3* 7.6* 7.4* 8.2* 7.4* 7.8* 7.6*  MG 1.9 1.7  --   --  1.7 1.8 1.9  PHOS 1.5* 2.6 2.6  --  2.8  --   --  Liver Function Tests: Recent Labs  Lab 11/09/23 1711 11/10/23 0441 11/11/23 0345 11/13/23 0317 11/14/23 0415  AST 56* 41  --   --  31  ALT 35 27  --   --  33  ALKPHOS 105 74  --   --  81  BILITOT 1.0 1.3*  --   --  0.7  PROT 7.0 6.0*  --   --  5.5*  ALBUMIN 3.8 2.9* 2.6* 2.2* 2.4*   No results for input(s): LIPASE, AMYLASE in the last 168 hours. No results for input(s): AMMONIA in the last 168 hours. Cardiac Enzymes: Recent Labs  Lab 11/09/23 2208 11/10/23 0141 11/11/23 0345 11/12/23 0800  CKTOTAL 2,049* 1,655* 1,044* 823*   BNP (last 3 results) No results for input(s): BNP in the last 8760 hours.  ProBNP (last 3 results) No results for input(s): PROBNP in the last 8760 hours.  CBG: Recent Labs  Lab 11/15/23 0746 11/15/23 1210 11/15/23 1626 11/15/23 2203 11/16/23 0739  GLUCAP 125* 112* 114* 133* 130*   Recent Results (from the past 240 hours)  Culture, blood (routine x 2)     Status: None   Collection Time: 11/09/23  5:11 PM   Specimen: BLOOD  Result Value Ref Range Status   Specimen Description   Final    BLOOD LEFT ANTECUBITAL Performed at [facility], [address], [place], [place] [address]    Special Requests   Final    Blood Culture adequate volume Performed at [facility], [address], [place], [place] [address]    Culture   Final    NO GROWTH 5 DAYS Performed at [facility], [address] [place], [place] [address]    Report Status 11/14/2023 FINAL  Final  Culture, blood (routine x 2)     Status: None   Collection Time: 11/09/23 10:09 PM   Specimen: BLOOD  Result Value Ref Range Status   Specimen Description   Final    BLOOD BLOOD RIGHT HAND AEROBIC BOTTLE ONLY Performed at [facility], [address] [place], [place] [address]    Special Requests   Final    Blood Culture results may not be optimal due to an inadequate volume of blood received in culture bottles Performed at [facility], [address] [place], [place] [address]    Culture   Final    NO GROWTH 5 DAYS Performed at [facility], [address], [place], [place] [address]    Report Status 11/15/2023 FINAL  Final  MRSA Next Gen by PCR, Nasal     Status: None   Collection Time: 11/09/23 11:41 PM   Specimen: Nasal Mucosa; Nasal Swab  Result Value Ref Range Status   MRSA by PCR Next Gen NOT DETECTED NOT DETECTED Final    Comment: (NOTE) The GeneXpert MRSA Assay (FDA approved for NASAL specimens only), is one component of a comprehensive MRSA colonization surveillance program. It is not intended to diagnose MRSA infection nor to guide or monitor treatment for MRSA infections. Test performance is not FDA approved in patients less than 38 years old. Performed at [facility], [address] [place], [place] [address]      Studies: No results found.    Tassie Pollett, MD  [facility] 11/16/2023  If 7PM-7AM, please contact night-coverage

## 2023-11-16 NOTE — Plan of Care (Signed)
  Problem: Health Behavior/Discharge Planning: Goal: Ability to manage health-related needs will improve Outcome: Progressing   Problem: Activity: Goal: Risk for activity intolerance will decrease Outcome: Progressing   Problem: Nutrition: Goal: Adequate nutrition will be maintained Outcome: Progressing   Problem: Pain Managment: Goal: General experience of comfort will improve and/or be controlled Outcome: Progressing   Problem: Safety: Goal: Ability to remain free from injury will improve Outcome: Progressing

## 2023-11-16 NOTE — Plan of Care (Signed)
  Problem: Pain Managment: Goal: General experience of comfort will improve and/or be controlled Outcome: Progressing   Problem: Coping: Goal: Level of anxiety will decrease Outcome: Progressing   Problem: Safety: Goal: Ability to remain free from injury will improve Outcome: Progressing

## 2023-11-17 DIAGNOSIS — L03115 Cellulitis of right lower limb: Secondary | ICD-10-CM | POA: Diagnosis not present

## 2023-11-17 LAB — CBC WITH DIFFERENTIAL/PLATELET
Abs Immature Granulocytes: 1.36 K/uL — ABNORMAL HIGH (ref 0.00–0.07)
Basophils Absolute: 0 K/uL (ref 0.0–0.1)
Basophils Relative: 0 %
Eosinophils Absolute: 0.8 K/uL — ABNORMAL HIGH (ref 0.0–0.5)
Eosinophils Relative: 4 %
HCT: 40.7 % (ref 36.0–46.0)
Hemoglobin: 12.8 g/dL (ref 12.0–15.0)
Immature Granulocytes: 7 %
Lymphocytes Relative: 13 %
Lymphs Abs: 2.5 K/uL (ref 0.7–4.0)
MCH: 28.1 pg (ref 26.0–34.0)
MCHC: 31.4 g/dL (ref 30.0–36.0)
MCV: 89.5 fL (ref 80.0–100.0)
Monocytes Absolute: 0.8 K/uL (ref 0.1–1.0)
Monocytes Relative: 4 %
Neutro Abs: 14.1 K/uL — ABNORMAL HIGH (ref 1.7–7.7)
Neutrophils Relative %: 72 %
Platelets: 510 K/uL — ABNORMAL HIGH (ref 150–400)
RBC: 4.55 MIL/uL (ref 3.87–5.11)
RDW: 15.1 % (ref 11.5–15.5)
WBC: 19.6 K/uL — ABNORMAL HIGH (ref 4.0–10.5)
nRBC: 0 % (ref 0.0–0.2)

## 2023-11-17 LAB — GLUCOSE, CAPILLARY
Glucose-Capillary: 141 mg/dL — ABNORMAL HIGH (ref 70–99)
Glucose-Capillary: 176 mg/dL — ABNORMAL HIGH (ref 70–99)
Glucose-Capillary: 78 mg/dL (ref 70–99)
Glucose-Capillary: 112 mg/dL — ABNORMAL HIGH (ref 70–99)

## 2023-11-17 LAB — BASIC METABOLIC PANEL WITH GFR
Anion gap: 9 (ref 5–15)
BUN: 10 mg/dL (ref 8–23)
CO2: 24 mmol/L (ref 22–32)
Calcium: 7.7 mg/dL — ABNORMAL LOW (ref 8.9–10.3)
Chloride: 105 mmol/L (ref 98–111)
Creatinine, Ser: 0.96 mg/dL (ref 0.44–1.00)
GFR, Estimated: >60 mL/min (ref >60)
Glucose, Bld: 118 mg/dL — ABNORMAL HIGH (ref 70–99)
Potassium: 3.7 mmol/L (ref 3.5–5.1)
Sodium: 138 mmol/L (ref 135–145)

## 2023-11-17 LAB — CK: Total CK: 95 U/L (ref 38–234)

## 2023-11-17 LAB — MAGNESIUM: Magnesium: 2 mg/dL (ref 1.7–2.4)

## 2023-11-17 MED ORDER — CALAMINE EX LOTN
1.0000 | TOPICAL_LOTION | CUTANEOUS | Status: DC | PRN
  Administered 2023-11-17: 1 via TOPICAL
  Filled 2023-11-17: qty 177

## 2023-11-17 NOTE — Progress Notes (Signed)
 Mobility Specialist - Progress Note   11/17/23 0935  Mobility  Activity Ambulated with assistance in hallway  Level of Assistance Modified independent, requires aide device or extra time  Assistive Device Front wheel walker  Distance Ambulated (ft) 700 ft  Activity Response Tolerated well  Mobility Referral Yes  Mobility visit 1 Mobility  Mobility Specialist Start Time (ACUTE ONLY) 0913  Mobility Specialist Stop Time (ACUTE ONLY) 0933  Mobility Specialist Time Calculation (min) (ACUTE ONLY) 20 min   Pt received in bed and agreeable to mobility. No complaints during session. Pt to bed after session with all needs met.    [facility]

## 2023-11-17 NOTE — Plan of Care (Signed)
  Problem: Health Behavior/Discharge Planning: Goal: Ability to manage health-related needs will improve Outcome: Progressing   Problem: Clinical Measurements: Goal: Ability to maintain clinical measurements within normal limits will improve Outcome: Progressing   Problem: Activity: Goal: Risk for activity intolerance will decrease Outcome: Progressing   Problem: Coping: Goal: Level of anxiety will decrease Outcome: Progressing   Problem: Pain Managment: Goal: General experience of comfort will improve and/or be controlled Outcome: Progressing

## 2023-11-17 NOTE — Progress Notes (Signed)
 [facility]  Date of Admission:  11/09/2023   Total days of inpatient antibiotics 6  Principal Problem:   Cellulitis Active Problems:   Mixed hyperlipidemia   History of pulmonary embolism   AKI (acute kidney injury) (HCC)   Dehydration   Hypokalemia   DM2 (diabetes mellitus, type 2) (HCC)   Hypophosphatemia   Rhabdomyolysis          Assessment: 68 year old female admitted with #Right leg cellulitis. #Beta-lactam rash-resolved #Leukocytosis secondary to #thrombocytosis # Diabetes mellitus - Leg erythema was not improving assess CT ordered.  Patient has been on linezolid .  Leukocytosis of 15K today.  Recommendations: -Continue linezolid  -F/U CT scan -Discussed with pt that erythema may take a few weeks to completely resolved. I reviewed priro images and looks improved today. Pending CT, if shows underlying abscess will need intervention ADDENDUM: CT no abscess, Showed soft tissue swelling c/w celluitis, continue linezolid  for now/ -ADD diff to cbc -Body rash resolved, pruritus resolved  Evaluation of this patient requires complex antimicrobial therapy evaluation and counseling + isolation needs for disease transmission risk assessment and mitigation    Microbiology:   Antibiotics: Linezolid  Cultures: Blood 7/5 ng Urine  Other   SUBJECTIVE: Resitn g in bed Interval: afebirle overingh  Review of Systems: ROS   Scheduled Meds:  apixaban   2.5 mg Oral BID   ezetimibe   10 mg Oral Daily   famotidine   20 mg Oral BID   hydrOXYzine   10 mg Oral TID   insulin  aspart  0-15 Units Subcutaneous TID WC   Continuous Infusions:  linezolid  (ZYVOX ) IV 600 mg (11/16/23 2159)   PRN Meds:.acetaminophen  **OR** acetaminophen , diphenhydrAMINE , HYDROcodone -acetaminophen , ondansetron  **OR** ondansetron  (ZOFRAN ) IV, sodium chloride  flush Allergies  Allergen Reactions   Cefepime  Hives, Itching and Rash   Rocephin [Ceftriaxone] Hives, Itching  and Rash   Doxycycline Hyclate     Other Reaction(s): possible doxycycline vs IV contrast- rash   Prednisone  & Diphenhydramine      Other Reaction(s): possible contrast allergy vs doxycycline. rash.    OBJECTIVE: Vitals:   11/15/23 2204 11/16/23 0547 11/16/23 1348 11/16/23 2103  BP: (!) 142/72 (!) 136/56 (!) 151/60 (!) 152/48  Pulse: 86 74 77 87  Resp: 15 15 17 18   Temp: 98.9 F (37.2 C) 98.2 F (36.8 C) 98 F (36.7 C) 98.5 F (36.9 C)  TempSrc: Oral Oral Oral Oral  SpO2: 94% 95% 96% 95%  Weight:      Height:       Body mass index is 41.2 kg/m.  Physical Exam Constitutional:      Appearance: Normal appearance.  HENT:     Head: Normocephalic and atraumatic.     Right Ear: Tympanic membrane normal.     Left Ear: Tympanic membrane normal.     Nose: Nose normal.     Mouth/Throat:     Mouth: Mucous membranes are moist.  Eyes:     Extraocular Movements: Extraocular movements intact.     Conjunctiva/sclera: Conjunctivae normal.     Pupils: Pupils are equal, round, and reactive to light.  Cardiovascular:     Rate and Rhythm: Normal rate and regular rhythm.     Heart sounds: No murmur heard.    No friction rub. No gallop.  Pulmonary:     Effort: Pulmonary effort is normal.     Breath sounds: Normal breath sounds.  Abdominal:     General: Abdomen is flat.     Palpations:  Abdomen is soft.  Musculoskeletal:        General: Normal range of motion.  Skin:    General: Skin is warm and dry.  Neurological:     General: No focal deficit present.     Mental Status: She is alert and oriented to person, place, and time.  Psychiatric:        Mood and Affect: Mood normal.       Lab Results Lab Results  Component Value Date   WBC 15.2 (H) 11/16/2023   HGB 12.5 11/16/2023   HCT 39.3 11/16/2023   MCV 88.7 11/16/2023   PLT 444 (H) 11/16/2023    Lab Results  Component Value Date   CREATININE 0.70 11/16/2023   BUN 9 11/16/2023   NA 137 11/16/2023   K 3.4 (L) 11/16/2023    CL 103 11/16/2023   CO2 22 11/16/2023    Lab Results  Component Value Date   ALT 33 11/14/2023   AST 31 11/14/2023   ALKPHOS 81 11/14/2023   BILITOT 0.7 11/14/2023        Loney Stank, MD [facility] 11/17/2023, 3:23 AM

## 2023-11-17 NOTE — Progress Notes (Addendum)
 PROGRESS NOTE  Claudia Holmes FMW:[id number] DOB: 25-Jul-1955 DOA: 11/09/2023 PCP: Claudia Toribio MATSU, MD   LOS: 7 days   Brief narrative:  Claudia Holmes is a 68 y.o. female with past medical history of diabetes mellitus type 2, hypertension, hyperlipidemia, bilateral lower extremity skin graft, DVT/PE presented hospital with right lower extremity pain, swelling, fatigue nausea..  On initial presentation vitals were stable.  Labs showed leukocytosis at 16.9, lactic acid 2.3>> 1.0, CK 2049, phosphorous 1.9, potassium 3.2, mildly elevated creatinine but not meeting the criteria for AKI. CT scan of right lower extremity with no concern of abscess or osteomyelitis.  Subcutaneous soft tissue edema consistent with cellulitis.  Patient was then admitted to the hospital for further evaluation and treatment   Assessment/Plan: Principal Problem:   Cellulitis Active Problems:   AKI (acute kidney injury) (HCC)   Rhabdomyolysis   Hypokalemia   Hypophosphatemia   DM2 (diabetes mellitus, type 2) (HCC)   History of pulmonary embolism   Mixed hyperlipidemia   Dehydration  Cellulitis in the setting of history of skin graft Venous stasis ulcers Afebrile but leukocytosis.  ID on board and underwent CT scan of the right lower extremity without findings of abscess but cellulitis. MRSA PCR negative, blood cultures negative in 5 days.  Currently on linezolid .  Follow ID recommendations.  Still with significant leukocytosis and uptrending but no fever.  Will continue to monitor CBC trend.  Drug rash.  Decreased in intensity but still with itching.  Will add calamine lotion.   Elevated creatinine. AKI ruled out.  Latest creatinine of 0.9.   Rhabdomyolysis CK elevated above 2000 on admission.  Improved CK levels.  Check CK levels in AM.  Hypokalemia Replenished, latest potassium of 3.7.   Hypophosphatemia Replenished and improved-check levels in a.m.   DM2 (diabetes mellitus, type 2) CBG within goal  and A1c of 5.9. Continue sliding scale insulin .  Latest POC glucose of 141   History of pulmonary embolism Continue Eliquis    Mixed hyperlipidemia Lipitor on hold due to rhabdomyolysis.   Class III obesity.Body mass index is 41.2 kg/m.  Would benefit from lifestyle modification and weight loss as outpatient.  DVT prophylaxis: apixaban  (ELIQUIS ) tablet 2.5 mg Start: 11/09/23 2345 apixaban  (ELIQUIS ) tablet 2.5 mg   Disposition: Likely home with home health in 1 to 2 days  Status is: Inpatient Remains inpatient appropriate because: IV antibiotics, pending clinical improvement,    Code Status:     Code Status: Full Code  Family Communication: none   Consultants: Infectious disease  Procedures: None yet  Anti-infectives:  Linezolid  IV  Anti-infectives (From admission, onward)    Start     Dose/Rate Route Frequency Ordered Stop   11/12/23 2000  linezolid  (ZYVOX ) IVPB 600 mg        600 mg 300 mL/hr over 60 Minutes Intravenous Every 12 hours 11/12/23 1400     11/11/23 2200  vancomycin  (VANCOREADY) IVPB 1500 mg/300 mL  Status:  Discontinued        1,500 mg 150 mL/hr over 120 Minutes Intravenous Daily at bedtime 11/11/23 0739 11/12/23 1400   11/11/23 0000  vancomycin  (VANCOCIN ) IVPB 1000 mg/200 mL premix  Status:  Discontinued        1,000 mg 200 mL/hr over 60 Minutes Intravenous Every 24 hours 11/10/23 0024 11/10/23 0820   11/10/23 0900  ceFEPIme  (MAXIPIME ) 2 g in sodium chloride  0.9 % 100 mL IVPB  Status:  Discontinued        2 g 200  mL/hr over 30 Minutes Intravenous Every 8 hours 11/10/23 0822 11/12/23 1400   11/10/23 0819  vancomycin  (VANCOREADY) IVPB 1250 mg/250 mL  Status:  Discontinued        1,250 mg 166.7 mL/hr over 90 Minutes Intravenous Daily at bedtime 11/10/23 0820 11/11/23 0739   11/10/23 0000  ceFEPIme  (MAXIPIME ) 2 g in sodium chloride  0.9 % 100 mL IVPB  Status:  Discontinued        2 g 200 mL/hr over 30 Minutes Intravenous Every 12 hours 11/09/23 2302  11/10/23 0822   11/10/23 0000  vancomycin  (VANCOREADY) IVPB 2000 mg/400 mL        2,000 mg 200 mL/hr over 120 Minutes Intravenous  Once 11/09/23 2302 11/10/23 0218   11/10/23 0000  vancomycin  (VANCOCIN ) IVPB 1000 mg/200 mL premix  Status:  Discontinued        1,000 mg 200 mL/hr over 60 Minutes Intravenous Every 24 hours 11/09/23 2308 11/10/23 0024       Subjective: Today, patient was seen and examined at bedside.  Patient continues to have some discomfort at the leg but overall better.  Body rash has decreased in intensity but he still complains of itching.   Objective: Vitals:   11/16/23 2103 11/17/23 0605  BP: (!) 152/48 133/67  Pulse: 87 85  Resp: 18 19  Temp: 98.5 F (36.9 C) 98.4 F (36.9 C)  SpO2: 95% 96%    Intake/Output Summary (Last 24 hours) at 11/17/2023 1013 Last data filed at 11/17/2023 0909 Gross per 24 hour  Intake 1560.76 ml  Output --  Net 1560.76 ml   Filed Weights   11/09/23 1432  Weight: 108.9 kg   Body mass index is 41.2 kg/m.   Physical Exam: GENERAL: Patient is alert awake and oriented. Not in obvious distress.  Obese built HENT: No scleral pallor or icterus. Pupils equally reactive to light. Oral mucosa is moist NECK: is supple, no gross swelling noted. CHEST: Clear to auscultation.  CVS: S1 and S2 heard, no murmur. Regular rate and rhythm.  ABDOMEN: Soft, non-tender, bowel sounds are present. EXTREMITIES: Bilateral lower extremity with erythema deformity and area of focal tenderness over the right calf. CNS: Cranial nerves are intact. No focal motor deficits. SKIN: warm and dry, bilateral lower extremity venous stasis with right lower extremity ulcers, erythematous rash over the skin decreased in intensity.  Data Review: I have personally reviewed the following laboratory data and studies,  CBC: Recent Labs  Lab 11/13/23 0317 11/14/23 0415 11/15/23 0524 11/16/23 1037 11/17/23 0401  WBC 15.0* 12.4* 16.0* 15.2* 19.6*  NEUTROABS  --    --   --   --  14.1*  HGB 10.8* 11.0* 12.4 12.5 12.8  HCT 33.1* 34.5* 38.3 39.3 40.7  MCV 86.2 86.9 87.0 88.7 89.5  PLT 255 324 444* 444* 510*   Basic Metabolic Panel: Recent Labs  Lab 11/11/23 0345 11/12/23 0800 11/13/23 0317 11/14/23 0415 11/15/23 0524 11/16/23 1037 11/17/23 0401  NA 135   < > 138 139 137 137 138  K 3.5   < > 3.3* 3.7 3.5 3.4* 3.7  CL 105   < > 106 107 106 103 105  CO2 21*   < > 24 22 23 22 24   GLUCOSE 115*   < > 102* 114* 118* 184* 118*  BUN 12   < > 9 9 8 9 10   CREATININE 0.84   < > 0.81 0.66 0.77 0.70 0.96  CALCIUM  7.4*   < > 7.4* 7.8*  7.6* 7.8* 7.7*  MG  --   --  1.7 1.8 1.9 2.0 2.0  PHOS 2.6  --  2.8  --   --   --   --    < > = values in this interval not displayed.   Liver Function Tests: Recent Labs  Lab 11/11/23 0345 11/13/23 0317 11/14/23 0415  AST  --   --  31  ALT  --   --  33  ALKPHOS  --   --  81  BILITOT  --   --  0.7  PROT  --   --  5.5*  ALBUMIN 2.6* 2.2* 2.4*   No results for input(s): LIPASE, AMYLASE in the last 168 hours. No results for input(s): AMMONIA in the last 168 hours. Cardiac Enzymes: Recent Labs  Lab 11/11/23 0345 11/12/23 0800  CKTOTAL 1,044* 823*   BNP (last 3 results) No results for input(s): BNP in the last 8760 hours.  ProBNP (last 3 results) No results for input(s): PROBNP in the last 8760 hours.  CBG: Recent Labs  Lab 11/16/23 0739 11/16/23 1153 11/16/23 1648 11/16/23 2105 11/17/23 0820  GLUCAP 130* 139* 125* 119* 141*   Recent Results (from the past 240 hours)  Culture, blood (routine x 2)     Status: None   Collection Time: 11/09/23  5:11 PM   Specimen: BLOOD  Result Value Ref Range Status   Specimen Description   Final    BLOOD LEFT ANTECUBITAL Performed at [facility], [address] [place], [place] [address]    Special Requests   Final    Blood Culture adequate volume Performed at [facility], [address] [place], [place]  [address]    Culture   Final    NO GROWTH 5 DAYS Performed at [facility], [address], [place], [place] [address]    Report Status 11/14/2023 FINAL  Final  Culture, blood (routine x 2)     Status: None   Collection Time: 11/09/23 10:09 PM   Specimen: BLOOD  Result Value Ref Range Status   Specimen Description   Final    BLOOD BLOOD RIGHT HAND AEROBIC BOTTLE ONLY Performed at [facility], [address] [place], [place] [address]    Special Requests   Final    Blood Culture results may not be optimal due to an inadequate volume of blood received in culture bottles Performed at [facility], [address], [place], [place] [address]    Culture   Final    NO GROWTH 5 DAYS Performed at [facility], [address] [place], [place] [address]    Report Status 11/15/2023 FINAL  Final  MRSA Next Gen by PCR, Nasal     Status: None   Collection Time: 11/09/23 11:41 PM   Specimen: Nasal Mucosa; Nasal Swab  Result Value Ref Range Status   MRSA by PCR Next Gen NOT DETECTED NOT DETECTED Final    Comment: (NOTE) The GeneXpert MRSA Assay (FDA approved for NASAL specimens only), is one component of a comprehensive MRSA colonization surveillance program. It is not intended to diagnose MRSA infection nor to guide or monitor treatment for MRSA infections. Test performance is not FDA approved in patients less than 58 years old. Performed at [facility], [address] [place], [place] [address]      Studies: CT TIBIA FIBULA RIGHT W CONTRAST Result Date: 11/16/2023 CLINICAL DATA:  Follow-up soft  tissue infection. Right lower extremity wound. EXAM: CT OF THE LOWER RIGHT EXTREMITY WITH CONTRAST TECHNIQUE: Multidetector CT imaging of the lower right extremity was performed according to the standard protocol following intravenous contrast administration. RADIATION DOSE REDUCTION: This exam was performed according to the  departmental dose-optimization program which includes automated exposure control, adjustment of the mA and/or kV according to patient size and/or use of iterative reconstruction technique. CONTRAST:  80mL OMNIPAQUE  IOHEXOL  300 MG/ML  SOLN COMPARISON:  CT scan 11/09/2023 FINDINGS: Stable knee and ankle joint degenerative changes. Healing/healed fractures involving the proximal and mid left fibula along with bilateral old malleolar fractures. No acute bony findings or destructive bony changes. No findings by CT suspicious for septic arthritis at the knee or ankle joints. Extensive soft tissue defect involving the medial aspect of the lower extremity adjacent to the tibia. Diffuse skin thickening and subcutaneous inflammation/edema suggesting cellulitis. No discrete rim enhancing fluid collection to suggest a drainable soft tissue abscess. Advanced fatty atrophy of the calf musculature but no findings suspicious for myofasciitis or pyomyositis. The major vascular structures are patent. IMPRESSION: 1. Extensive soft tissue defect involving the medial aspect of the lower extremity adjacent to the tibia. 2. Diffuse skin thickening and subcutaneous inflammation/edema suggesting cellulitis. No discrete rim enhancing fluid collection to suggest a drainable soft tissue abscess. 3. Advanced fatty atrophy of the calf musculature but no findings suspicious for myofasciitis or pyomyositis. 4. Healing/healed fractures involving the proximal and mid left fibula along with bilateral old malleolar fractures. 5. No findings by CT suspicious for septic arthritis or osteomyelitis. Electronically Signed   By: MYRTIS Stammer M.D.   On: 11/16/2023 16:57      Vernal Alstrom, MD  [facility] 11/17/2023  If 7PM-7AM, please contact night-coverage

## 2023-11-18 ENCOUNTER — Other Ambulatory Visit (HOSPITAL_COMMUNITY): Payer: Self-pay

## 2023-11-18 DIAGNOSIS — L539 Erythematous condition, unspecified: Secondary | ICD-10-CM | POA: Diagnosis not present

## 2023-11-18 DIAGNOSIS — A419 Sepsis, unspecified organism: Secondary | ICD-10-CM

## 2023-11-18 DIAGNOSIS — L03115 Cellulitis of right lower limb: Secondary | ICD-10-CM | POA: Diagnosis not present

## 2023-11-18 LAB — GLUCOSE, CAPILLARY
Glucose-Capillary: 108 mg/dL — ABNORMAL HIGH (ref 70–99)
Glucose-Capillary: 121 mg/dL — ABNORMAL HIGH (ref 70–99)

## 2023-11-18 MED ORDER — LINEZOLID 600 MG PO TABS
600.0000 mg | ORAL_TABLET | Freq: Two times a day (BID) | ORAL | Status: DC
  Filled 2023-11-18: qty 1

## 2023-11-18 MED ORDER — CALAMINE EX LOTN: 1.0000 | TOPICAL_LOTION | CUTANEOUS | Status: AC | PRN

## 2023-11-18 MED ORDER — LINEZOLID 600 MG PO TABS
600.0000 mg | ORAL_TABLET | Freq: Two times a day (BID) | ORAL | 0 refills | Status: AC
  Filled 2023-11-18: qty 28, 14d supply, fill #0

## 2023-11-18 MED ORDER — HYDROCODONE-ACETAMINOPHEN 5-325 MG PO TABS
1.0000 | ORAL_TABLET | Freq: Four times a day (QID) | ORAL | 0 refills | Status: AC | PRN
  Filled 2023-11-18: qty 10, 3d supply, fill #0

## 2023-11-18 MED ORDER — DIPHENHYDRAMINE HCL 25 MG PO TABS
25.0000 mg | ORAL_TABLET | Freq: Four times a day (QID) | ORAL | 0 refills | Status: AC | PRN
  Filled 2023-11-18: qty 15, 4d supply, fill #0

## 2023-11-18 NOTE — Plan of Care (Signed)
   Problem: Coping: Goal: Level of anxiety will decrease Outcome: Progressing   Problem: Safety: Goal: Ability to remain free from injury will improve Outcome: Progressing   Problem: Skin Integrity: Goal: Risk for impaired skin integrity will decrease Outcome: Progressing

## 2023-11-18 NOTE — Plan of Care (Signed)
 Discharge instructions given to the patient including medications and follow up.

## 2023-11-18 NOTE — Progress Notes (Signed)
 PROGRESS NOTE  Claudia Holmes FMW:[id number] DOB: 21-Mar-1956 DOA: 11/09/2023 PCP: Yolande Toribio MATSU, MD   LOS: 8 days   Brief narrative:  Claudia Holmes is a 68 y.o. female with past medical history of diabetes mellitus type 2, hypertension, hyperlipidemia, bilateral lower extremity skin graft, DVT/PE presented hospital with right lower extremity pain, swelling, fatigue nausea..  On initial presentation vitals were stable.  Labs showed leukocytosis at 16.9, lactic acid 2.3>> 1.0, CK 2049, phosphorous 1.9, potassium 3.2, mildly elevated creatinine but not meeting the criteria for AKI. CT scan of right lower extremity with no concern of abscess or osteomyelitis.  Subcutaneous soft tissue edema consistent with cellulitis.  Patient was then admitted to the hospital for further evaluation and treatment   Assessment/Plan: Principal Problem:   Cellulitis Active Problems:   AKI (acute kidney injury) (HCC)   Rhabdomyolysis   Hypokalemia   Hypophosphatemia   DM2 (diabetes mellitus, type 2) (HCC)   History of pulmonary embolism   Mixed hyperlipidemia   Dehydration  Cellulitis in the setting of history of skin graft Venous stasis ulcers Afebrile but leukocytosis still present..  ID on board and underwent CT scan of the right lower extremity without findings of abscess but cellulitis. MRSA PCR negative, blood cultures negative in 5 days.  Currently on linezolid .  Follow ID recommendations.  Will continue to monitor CBC trend.  Cellulitic area seems to be getting better.  Drug rash.  Decreased in intensity but still with itching.  Slightly better with calamine lotion.   Elevated creatinine. AKI ruled out.  Latest creatinine of 0.9.   Rhabdomyolysis CK elevated above 2000 on admission.  Improved CK levels.  Latest CK level at 95  Hypokalemia Replenished, latest potassium of 3.7.   Hypophosphatemia Replenished    DM2 (diabetes mellitus, type 2) CBG within goal and A1c of 5.9. Continue  sliding scale insulin .  Latest POC glucose of 108   History of pulmonary embolism Continue Eliquis    Mixed hyperlipidemia Lipitor on hold due to rhabdomyolysis.   Class III obesity.Body mass index is 41.2 kg/m.  Would benefit from lifestyle modification and weight loss as outpatient.  DVT prophylaxis: apixaban  (ELIQUIS ) tablet 2.5 mg Start: 11/09/23 2345 apixaban  (ELIQUIS ) tablet 2.5 mg   Disposition: Likely home with home health in 1 to 2 days  Status is: Inpatient Remains inpatient appropriate because: IV antibiotics, pending clinical improvement,    Code Status:     Code Status: Full Code  Family Communication: none   Consultants: Infectious disease  Procedures: None yet  Anti-infectives:  Linezolid  IV  Anti-infectives (From admission, onward)    Start     Dose/Rate Route Frequency Ordered Stop   11/12/23 2000  linezolid  (ZYVOX ) IVPB 600 mg        600 mg 300 mL/hr over 60 Minutes Intravenous Every 12 hours 11/12/23 1400     11/11/23 2200  vancomycin  (VANCOREADY) IVPB 1500 mg/300 mL  Status:  Discontinued        1,500 mg 150 mL/hr over 120 Minutes Intravenous Daily at bedtime 11/11/23 0739 11/12/23 1400   11/11/23 0000  vancomycin  (VANCOCIN ) IVPB 1000 mg/200 mL premix  Status:  Discontinued        1,000 mg 200 mL/hr over 60 Minutes Intravenous Every 24 hours 11/10/23 0024 11/10/23 0820   11/10/23 0900  ceFEPIme  (MAXIPIME ) 2 g in sodium chloride  0.9 % 100 mL IVPB  Status:  Discontinued        2 g 200 mL/hr over 30  Minutes Intravenous Every 8 hours 11/10/23 0822 11/12/23 1400   11/10/23 0819  vancomycin  (VANCOREADY) IVPB 1250 mg/250 mL  Status:  Discontinued        1,250 mg 166.7 mL/hr over 90 Minutes Intravenous Daily at bedtime 11/10/23 0820 11/11/23 0739   11/10/23 0000  ceFEPIme  (MAXIPIME ) 2 g in sodium chloride  0.9 % 100 mL IVPB  Status:  Discontinued        2 g 200 mL/hr over 30 Minutes Intravenous Every 12 hours 11/09/23 2302 11/10/23 0822   11/10/23  0000  vancomycin  (VANCOREADY) IVPB 2000 mg/400 mL        2,000 mg 200 mL/hr over 120 Minutes Intravenous  Once 11/09/23 2302 11/10/23 0218   11/10/23 0000  vancomycin  (VANCOCIN ) IVPB 1000 mg/200 mL premix  Status:  Discontinued        1,000 mg 200 mL/hr over 60 Minutes Intravenous Every 24 hours 11/09/23 2308 11/10/23 0024       Subjective: Today, patient was seen and examined at bedside.  Complains of mild itching but better.  Redness on the legs including induration has improved.  Objective: Vitals:   11/17/23 2135 11/18/23 0544  BP: (!) 113/45 124/64  Pulse: 80 73  Resp: 18 20  Temp: 98.8 F (37.1 C) 98.1 F (36.7 C)  SpO2: 95% 96%    Intake/Output Summary (Last 24 hours) at 11/18/2023 0830 Last data filed at 11/18/2023 0600 Gross per 24 hour  Intake 1200 ml  Output --  Net 1200 ml   Filed Weights   11/09/23 1432  Weight: 108.9 kg   Body mass index is 41.2 kg/m.   Physical Exam:  General: Obese built, not in obvious distress HENT:   No scleral pallor or icterus noted. Oral mucosa is moist.  Chest:  Clear breath sounds.  Diminished breath sounds bilaterally. No crackles or wheezes.  CVS: S1 &S2 heard. No murmur.  Regular rate and rhythm. Abdomen: Soft, nontender, nondistended.  Bowel sounds are heard.   Extremities: Bilateral lower extremity erythema with focal area of tenderness over the right calf which is better with less induration at this time. Psych: Alert, awake and oriented, normal mood CNS:  No cranial nerve deficits.  Power equal in all extremities.   Skin: Warm and dry.  Erythematous rash over the skin decreased.  Venous stasis ulceration with some scabbing.   Data Review: I have personally reviewed the following laboratory data and studies,  CBC: Recent Labs  Lab 11/13/23 0317 11/14/23 0415 11/15/23 0524 11/16/23 1037 11/17/23 0401  WBC 15.0* 12.4* 16.0* 15.2* 19.6*  NEUTROABS  --   --   --   --  14.1*  HGB 10.8* 11.0* 12.4 12.5 12.8  HCT  33.1* 34.5* 38.3 39.3 40.7  MCV 86.2 86.9 87.0 88.7 89.5  PLT 255 324 444* 444* 510*   Basic Metabolic Panel: Recent Labs  Lab 11/13/23 0317 11/14/23 0415 11/15/23 0524 11/16/23 1037 11/17/23 0401  NA 138 139 137 137 138  K 3.3* 3.7 3.5 3.4* 3.7  CL 106 107 106 103 105  CO2 24 22 23 22 24   GLUCOSE 102* 114* 118* 184* 118*  BUN 9 9 8 9 10   CREATININE 0.81 0.66 0.77 0.70 0.96  CALCIUM  7.4* 7.8* 7.6* 7.8* 7.7*  MG 1.7 1.8 1.9 2.0 2.0  PHOS 2.8  --   --   --   --    Liver Function Tests: Recent Labs  Lab 11/13/23 0317 11/14/23 0415  AST  --  31  ALT  --  33  ALKPHOS  --  81  BILITOT  --  0.7  PROT  --  5.5*  ALBUMIN 2.2* 2.4*   No results for input(s): LIPASE, AMYLASE in the last 168 hours. No results for input(s): AMMONIA in the last 168 hours. Cardiac Enzymes: Recent Labs  Lab 11/12/23 0800 11/17/23 0401  CKTOTAL 823* 95   BNP (last 3 results) No results for input(s): BNP in the last 8760 hours.  ProBNP (last 3 results) No results for input(s): PROBNP in the last 8760 hours.  CBG: Recent Labs  Lab 11/17/23 0820 11/17/23 1141 11/17/23 1648 11/17/23 2137 11/18/23 0720  GLUCAP 141* 176* 78 112* 108*   Recent Results (from the past 240 hours)  Culture, blood (routine x 2)     Status: None   Collection Time: 11/09/23  5:11 PM   Specimen: BLOOD  Result Value Ref Range Status   Specimen Description   Final    BLOOD LEFT ANTECUBITAL Performed at [facility], [address] [place], [place] [address]    Special Requests   Final    Blood Culture adequate volume Performed at [facility], [address], [place], [place] [address]    Culture   Final    NO GROWTH 5 DAYS Performed at [facility], [address] [place], [place] [address]    Report Status 11/14/2023 FINAL  Final  Culture, blood (routine x 2)     Status: None   Collection Time: 11/09/23 10:09 PM   Specimen: BLOOD  Result Value  Ref Range Status   Specimen Description   Final    BLOOD BLOOD RIGHT HAND AEROBIC BOTTLE ONLY Performed at [facility], [address], [place], [place] [address]    Special Requests   Final    Blood Culture results may not be optimal due to an inadequate volume of blood received in culture bottles Performed at [facility], [address] [place], [place] [address]    Culture   Final    NO GROWTH 5 DAYS Performed at [facility], [address] [place], [place] [address]    Report Status 11/15/2023 FINAL  Final  MRSA Next Gen by PCR, Nasal     Status: None   Collection Time: 11/09/23 11:41 PM   Specimen: Nasal Mucosa; Nasal Swab  Result Value Ref Range Status   MRSA by PCR Next Gen NOT DETECTED NOT DETECTED Final    Comment: (NOTE) The GeneXpert MRSA Assay (FDA approved for NASAL specimens only), is one component of a comprehensive MRSA colonization surveillance program. It is not intended to diagnose MRSA infection nor to guide or monitor treatment for MRSA infections. Test performance is not FDA approved in patients less than 51 years old. Performed at [facility], [address] [place], [place] [address]      Studies: CT TIBIA FIBULA RIGHT W CONTRAST Result Date: 11/16/2023 CLINICAL DATA:  Follow-up soft tissue infection. Right lower extremity wound. EXAM: CT OF THE LOWER RIGHT EXTREMITY WITH CONTRAST TECHNIQUE: Multidetector CT imaging of the lower right extremity was performed according to the standard protocol following intravenous contrast administration. RADIATION DOSE REDUCTION: This exam was performed according to the departmental dose-optimization program which includes automated exposure control, adjustment of the mA and/or kV according to patient size and/or use of iterative reconstruction technique. CONTRAST:  80mL OMNIPAQUE  IOHEXOL  300 MG/ML  SOLN COMPARISON:  CT scan  11/09/2023 FINDINGS: Stable knee  and ankle joint degenerative changes. Healing/healed fractures involving the proximal and mid left fibula along with bilateral old malleolar fractures. No acute bony findings or destructive bony changes. No findings by CT suspicious for septic arthritis at the knee or ankle joints. Extensive soft tissue defect involving the medial aspect of the lower extremity adjacent to the tibia. Diffuse skin thickening and subcutaneous inflammation/edema suggesting cellulitis. No discrete rim enhancing fluid collection to suggest a drainable soft tissue abscess. Advanced fatty atrophy of the calf musculature but no findings suspicious for myofasciitis or pyomyositis. The major vascular structures are patent. IMPRESSION: 1. Extensive soft tissue defect involving the medial aspect of the lower extremity adjacent to the tibia. 2. Diffuse skin thickening and subcutaneous inflammation/edema suggesting cellulitis. No discrete rim enhancing fluid collection to suggest a drainable soft tissue abscess. 3. Advanced fatty atrophy of the calf musculature but no findings suspicious for myofasciitis or pyomyositis. 4. Healing/healed fractures involving the proximal and mid left fibula along with bilateral old malleolar fractures. 5. No findings by CT suspicious for septic arthritis or osteomyelitis. Electronically Signed   By: MYRTIS Stammer M.D.   On: 11/16/2023 16:57      Vernal Alstrom, MD  [facility] 11/18/2023  If 7PM-7AM, please contact night-coverage

## 2023-11-18 NOTE — Progress Notes (Signed)
 Discharge mediations delivered to patient at bedside [facility]

## 2023-11-18 NOTE — Progress Notes (Signed)
 [facility]  Date of Admission:  11/09/2023   Total days of inpatient antibiotics 6  Principal Problem:   Cellulitis Active Problems:   Mixed hyperlipidemia   History of pulmonary embolism   AKI (acute kidney injury) (HCC)   Dehydration   Hypokalemia   DM2 (diabetes mellitus, type 2) (HCC)   Hypophosphatemia   Rhabdomyolysis          Assessment: 68 year old female admitted with #Right leg cellulitis. #Beta-lactam rash-resolved # Diabetes mellitus - Leg erythema was not improving. CT showed no abscess -- slower erythema regression ?has anything to do with most of her skin appears to be grafted skin -- good appetite, getting around hospital room fine   Recommendations: -Continue linezolid  -- will plan 2 more weeks until 7/28 -will follow up in clinic in around 7-10 days for cellulitis and lab assessment on zyvox  -ok to d/c from id standpoint -id clinic f/u with dr Dea on 7/22 @ 1030 -maintain standard isolation precaution inhouse  -will sign off -discuss with [facility]   Microbiology:   Antibiotics: Linezolid  Cultures: Blood 7/5 ng Urine  Other   SUBJECTIVE:  Interval: afebirle over night; feeling better; good appetite; no n/v/d; rash improving; no pain in the calf anymore  Review of Systems: ROS   Scheduled Meds:  apixaban   2.5 mg Oral BID   ezetimibe   10 mg Oral Daily   famotidine   20 mg Oral BID   hydrOXYzine   10 mg Oral TID   insulin  aspart  0-15 Units Subcutaneous TID WC   Continuous Infusions:  linezolid  (ZYVOX ) IV 600 mg (11/18/23 0934)   PRN Meds:.acetaminophen  **OR** acetaminophen , calamine, diphenhydrAMINE , HYDROcodone -acetaminophen , ondansetron  **OR** ondansetron  (ZOFRAN ) IV, sodium chloride  flush Allergies  Allergen Reactions   Cefepime  Hives, Itching and Rash   Rocephin [Ceftriaxone] Hives, Itching and Rash   Doxycycline Hyclate     Other Reaction(s): possible doxycycline vs IV contrast- rash    Prednisone  & Diphenhydramine      Other Reaction(s): possible contrast allergy vs doxycycline. rash.    OBJECTIVE: Vitals:   11/17/23 1354 11/17/23 1457 11/17/23 2135 11/18/23 0544  BP: 133/60 135/65 (!) 113/45 124/64  Pulse: 74 77 80 73  Resp: 18 16 18 20   Temp: 98.5 F (36.9 C) (!) 97.5 F (36.4 C) 98.8 F (37.1 C) 98.1 F (36.7 C)  TempSrc: Oral  Oral Oral  SpO2: 99% 98% 95% 96%  Weight:      Height:       Body mass index is 41.2 kg/m.  Physical Exam Constitutional:      Appearance: Normal appearance.  HENT:     Head: Normocephalic and atraumatic.     Right Ear: Tympanic membrane normal.     Left Ear: Tympanic membrane normal.     Nose: Nose normal.     Mouth/Throat:     Mouth: Mucous membranes are moist.  Eyes:     Extraocular Movements: Extraocular movements intact.     Conjunctiva/sclera: Conjunctivae normal.     Pupils: Pupils are equal, round, and reactive to light.  Cardiovascular:     Rate and Rhythm: Normal rate and regular rhythm.     Heart sounds: No murmur heard.    No friction rub. No gallop.  Pulmonary:     Effort: Pulmonary effort is normal.     Breath sounds: Normal breath sounds.  Abdominal:     General: Abdomen is flat.     Palpations: Abdomen is soft.  Musculoskeletal:        General: Normal range of motion.  Skin:    General: Skin is warm and dry.  Neurological:     General: No focal deficit present.     Mental Status: She is alert and oriented to person, place, and time.  Psychiatric:        Mood and Affect: Mood normal.    Skin: erythema over all the grafted part to about knee level; tissue edema rle; nontender; crusting distal medial rle; no sore; no fluctuance    Lab Results Lab Results  Component Value Date   WBC 19.6 (H) 11/17/2023   HGB 12.8 11/17/2023   HCT 40.7 11/17/2023   MCV 89.5 11/17/2023   PLT 510 (H) 11/17/2023    Lab Results  Component Value Date   CREATININE 0.96 11/17/2023   BUN 10 11/17/2023   NA  138 11/17/2023   K 3.7 11/17/2023   CL 105 11/17/2023   CO2 24 11/17/2023    Lab Results  Component Value Date   ALT 33 11/14/2023   AST 31 11/14/2023   ALKPHOS 81 11/14/2023   BILITOT 0.7 11/14/2023     Imaging: Reviewed  7/12 ct tib fib right with contrast 1. Extensive soft tissue defect involving the medial aspect of the lower extremity adjacent to the tibia. 2. Diffuse skin thickening and subcutaneous inflammation/edema suggesting cellulitis. No discrete rim enhancing fluid collection to suggest a drainable soft tissue abscess. 3. Advanced fatty atrophy of the calf musculature but no findings suspicious for myofasciitis or pyomyositis. 4. Healing/healed fractures involving the proximal and mid left fibula along with bilateral old malleolar fractures. 5. No findings by CT suspicious for septic arthritis or osteomyelitis.   Constance ONEIDA Passer, MD [facility] 11/18/2023, 12:02 PM

## 2023-11-18 NOTE — Discharge Summary (Addendum)
 Physician Discharge Summary  Claudia Holmes FMW:[id number] DOB: [birth year] DOA: 11/09/2023  PCP: Yolande Toribio MATSU, MD  Admit date: 11/09/2023 Discharge date: 11/18/2023  Admitted From: Home  Discharge disposition: Home   Recommendations for Outpatient Follow-Up:   Follow up with your primary care provider in one week.  Check CBC, BMP, magnesium  in the next visit Follow-up with infectious disease clinic as has been scheduled.   Discharge Diagnosis:   Principal Problem:   Cellulitis Active Problems:   AKI (acute kidney injury) (HCC)   Rhabdomyolysis   Hypokalemia   Hypophosphatemia   DM2 (diabetes mellitus, type 2) (HCC)   History of pulmonary embolism   Mixed hyperlipidemia   Dehydration    Discharge Condition: Improved.  Diet recommendation: Low sodium, heart healthy.  Carbohydrate-modified.   Wound care: None.  Code status: Full.   History of Present Illness:    Claudia Holmes is a 68 y.o. female with past medical history of diabetes mellitus type 2, hypertension, hyperlipidemia, bilateral lower extremity skin graft, DVT/PE presented hospital with right lower extremity pain, swelling, fatigue nausea..  On initial presentation vitals were stable.  Labs showed leukocytosis at 16.9, lactic acid 2.3>> 1.0, CK 2049, phosphorous 1.9, potassium 3.2, mildly elevated creatinine but not meeting the criteria for AKI. CT scan of right lower extremity with no concern of abscess or osteomyelitis.  Subcutaneous soft tissue edema consistent with cellulitis.  Patient was then admitted to the hospital for further evaluation and treatment    Hospital Course:   Following conditions were addressed during hospitalization as listed below,  Cellulitis in the setting of history of skin graft Venous stasis ulcers Afebrile but leukocytosis still present..  ID on board and underwent CT scan of the right lower extremity without findings of abscess but cellulitis. MRSA PCR negative,  blood cultures negative in more than 5 days.  Patient received linezolid  during hospitalization and will be continued on linezolid  on discharge.  Feeling improved.  Will follow-up with ID as outpatient.  Sepsis has been ruled out.  Drug rash.  Decreased in intensity but still with itching.  Continue calamine lotion on discharge.   Elevated creatinine. AKI ruled out.  Latest creatinine of 0.9.   Rhabdomyolysis CK elevated above 2000 on admission.  Improved CK levels.  Latest CK level at 95   Hypokalemia Replenished, latest potassium of 3.7.   Hypophosphatemia Replenished    DM2 (diabetes mellitus, type 2) CBG within goal and A1c of 5.9. Continue sliding scale insulin .  Latest POC glucose of 108   History of pulmonary embolism Continue Eliquis    Mixed hyperlipidemia Will resume Lipitor on discharge.   Class III obesity.Body mass index is 41.2 kg/m.  Would benefit from lifestyle modification and weight loss as outpatient.   Disposition.  At this time, patient is stable for disposition home with outpatient ID and PCP follow-up.  Medical Consultants:   Infectious disease Procedures:    None Subjective:   Today, patient was seen and examined at bedside.  Complains of mild itching but better.  Redness on the legs including induration has improved.   Discharge Exam:   Vitals:   11/17/23 2135 11/18/23 0544  BP: (!) 113/45 124/64  Pulse: 80 73  Resp: 18 20  Temp: 98.8 F (37.1 C) 98.1 F (36.7 C)  SpO2: 95% 96%   Vitals:   11/17/23 1354 11/17/23 1457 11/17/23 2135 11/18/23 0544  BP: 133/60 135/65 (!) 113/45 124/64  Pulse: 74 77 80 73  Resp: 18  16 18 20   Temp: 98.5 F (36.9 C) (!) 97.5 F (36.4 C) 98.8 F (37.1 C) 98.1 F (36.7 C)  TempSrc: Oral  Oral Oral  SpO2: 99% 98% 95% 96%  Weight:      Height:        General: Alert awake, not in obvious distress, obese HENT: pupils equally reacting to light,  No scleral pallor or icterus noted. Oral mucosa is moist.   Chest:  Clear breath sounds.  No crackles or wheezes.  CVS: S1 &S2 heard. No murmur.  Regular rate and rhythm. Abdomen: Soft, nontender, nondistended.  Bowel sounds are heard.   Extremities: No cyanosis, clubbing, bilateral lower extremity erythema with focal area of tenderness on the calf with improved induration.  Psych: Alert, awake and oriented, normal mood CNS:  No cranial nerve deficits.  Power equal in all extremities.   Skin: Warm and dry.  Erythematous rash over the skin decreased. Venous stasis ulceration with some scabbing.   The results of significant diagnostics from this hospitalization (including imaging, microbiology, ancillary and laboratory) are listed below for reference.     Diagnostic Studies:   CT EXTREMITY LOWER RIGHT W CONTRAST Result Date: 11/09/2023 CLINICAL DATA:  Soft tissue infection EXAM: CT OF THE LOWER RIGHT EXTREMITY WITH CONTRAST TECHNIQUE: Multidetector CT imaging of the lower right extremity was performed according to the standard protocol following intravenous contrast administration. RADIATION DOSE REDUCTION: This exam was performed according to the departmental dose-optimization program which includes automated exposure control, adjustment of the mA and/or kV according to patient size and/or use of iterative reconstruction technique. CONTRAST:  95mL OMNIPAQUE  IOHEXOL  300 MG/ML  SOLN COMPARISON:  Knee series 04/11/2023 FINDINGS: Bones/Joint/Cartilage No acute bony abnormality. Specifically, no acute fracture, subluxation, or dislocation. No joint effusion within the right knee. Deformity noted in the region of the right fibular neck, likely related to old injury. This is unchanged since prior study Ligaments Suboptimally assessed by CT. Muscles and Tendons Unremarkable. Soft tissues Edema within the subcutaneous soft tissues in the right calf and ankle. No focal fluid collection to suggest abscess. IMPRESSION: No acute bony abnormality. Edema within the  subcutaneous soft tissues in the right calf and ankle. This could reflect cellulitis in the appropriate clinical setting. No focal fluid collection/abscess. Electronically Signed   By: Franky Crease M.D.   On: 11/09/2023 18:05     Labs:   Basic Metabolic Panel: No results for input(s): NA, K, CL, CO2, GLUCOSE, BUN, CREATININE, CALCIUM , MG, PHOS in the last 168 hours.  GFR Estimated Creatinine Clearance: 69.5 mL/min (by C-G formula based on SCr of 0.96 mg/dL). Liver Function Tests: No results for input(s): AST, ALT, ALKPHOS, BILITOT, PROT, ALBUMIN in the last 168 hours.  No results for input(s): LIPASE, AMYLASE in the last 168 hours. No results for input(s): AMMONIA in the last 168 hours. Coagulation profile No results for input(s): INR, PROTIME in the last 168 hours.  CBC: No results for input(s): WBC, NEUTROABS, HGB, HCT, MCV, PLT in the last 168 hours.  Cardiac Enzymes: No results for input(s): CKTOTAL, CKMB, CKMBINDEX, TROPONINI in the last 168 hours.  BNP: Invalid input(s): POCBNP CBG: Recent Labs  Lab 11/18/23 1129  GLUCAP 121*   D-Dimer No results for input(s): DDIMER in the last 72 hours. Hgb A1c No results for input(s): HGBA1C in the last 72 hours. Lipid Profile No results for input(s): CHOL, HDL, LDLCALC, TRIG, CHOLHDL, LDLDIRECT in the last 72 hours. Thyroid  function studies No results for input(s): TSH,  T4TOTAL, T3FREE, THYROIDAB in the last 72 hours.  Invalid input(s): FREET3 Anemia work up No results for input(s): VITAMINB12, FOLATE, FERRITIN, TIBC, IRON, RETICCTPCT in the last 72 hours. Microbiology No results found for this or any previous visit (from the past 240 hours).    Discharge Instructions:   Discharge Instructions     Diet - low sodium heart healthy   Complete by: As directed    Discharge instructions   Complete by: As directed     Follow-up with your primary care provider in 1 week.  Check blood work at that time.  Complete course of antibiotic.  Follow-up with infectious disease clinic as scheduled by the clinic with dr Dea on 7/22 @ 1030.  Seek medical attention for worsening symptoms.   Increase activity slowly   Complete by: As directed       Allergies as of 11/18/2023       Reactions   Cefepime  Hives, Itching, Rash   Rocephin [ceftriaxone] Hives, Itching, Rash   Doxycycline Hyclate    Other Reaction(s): possible doxycycline vs IV contrast- rash   Prednisone  & Diphenhydramine     Other Reaction(s): possible contrast allergy vs doxycycline. rash.        Medication List     STOP taking these medications    Mounjaro 5 MG/0.5ML Pen Generic drug: tirzepatide       TAKE these medications    acetaminophen  500 MG tablet Commonly known as: TYLENOL  Take 500 mg by mouth every 6 (six) hours as needed.   atorvastatin  20 MG tablet Commonly known as: LIPITOR Take 20 mg by mouth daily.   calamine lotion Apply 1 Application topically as needed for itching.   diphenhydrAMINE  25 MG tablet Commonly known as: BENADRYL  Take 1 tablet (25 mg total) by mouth every 6 (six) hours as needed for itching.   Eliquis  2.5 MG Tabs tablet Generic drug: apixaban  Take 2.5 mg by mouth 2 (two) times daily.   ezetimibe  10 MG tablet Commonly known as: ZETIA  Take 10 mg by mouth daily.   HYDROcodone -acetaminophen  5-325 MG tablet Commonly known as: NORCO/VICODIN Take 1 tablet by mouth every 6 (six) hours as needed for moderate pain (pain score 4-6) or severe pain (pain score 7-10).   linezolid  600 MG tablet Commonly known as: ZYVOX  Take 1 tablet (600 mg total) by mouth every 12 (twelve) hours for 14 days.   OneTouch Delica Plus Lancet33G Misc Three times weekly   OneTouch Verio test strip Generic drug: glucose blood 1 each. Three times weekly. (Not on a schedule)   Ozempic  (2 MG/DOSE) 8 MG/3ML Sopn Generic  drug: Semaglutide  (2 MG/DOSE) Inject 2 mg into the skin once a week.        Follow-up Information     Yolande Toribio MATSU, MD Follow up in 1 week(s).   Specialty: Internal Medicine Contact information: [address] [place] [address] [phone]         Dea Shiner, MD Follow up on 11/26/2023.   Specialty: Infectious Diseases Why: 1030 Contact information: [address] [place] [address] [phone]                  Time coordinating discharge: 39 minutes  Signed:  Kaidon Kinker  [facility] 11/25/2023, 7:59 AM

## 2023-11-18 NOTE — TOC Progression Note (Signed)
 Transition of Care [facility]) - Progression Note    Patient Details  Name: Claudia Holmes MRN: [id number] Date of Birth: 03-Sep-1955  [facility]) CM/SW Contact  Alfonse JONELLE Rex, RN Phone Number: 11/18/2023, 9:46 AM  Clinical Narrative:  remains inpatient appropriate, IV antibiotics, pending clinical improvement.  TOC following.            Expected Discharge Plan and Services                                               Social Determinants of Health (SDOH) Interventions SDOH Screenings   Food Insecurity: No Food Insecurity (11/09/2023)  Housing: Low Risk  (11/09/2023)  Transportation Needs: No Transportation Needs (11/09/2023)  Utilities: Not At Risk (11/09/2023)  Social Connections: Moderately Integrated (11/09/2023)  Tobacco Use: Low Risk  (11/09/2023)    Readmission Risk Interventions     No data to display

## 2023-11-26 ENCOUNTER — Other Ambulatory Visit: Payer: Self-pay

## 2023-11-26 ENCOUNTER — Ambulatory Visit (INDEPENDENT_AMBULATORY_CARE_PROVIDER_SITE_OTHER): Payer: Self-pay | Admitting: Infectious Diseases

## 2023-11-26 ENCOUNTER — Telehealth: Payer: Self-pay

## 2023-11-26 ENCOUNTER — Encounter: Payer: Self-pay | Admitting: Infectious Diseases

## 2023-11-26 VITALS — BP 116/80 | HR 86 | Temp 97.7°F | Ht 64.0 in | Wt 236.0 lb

## 2023-11-26 DIAGNOSIS — Z79899 Other long term (current) drug therapy: Secondary | ICD-10-CM | POA: Diagnosis not present

## 2023-11-26 DIAGNOSIS — L270 Generalized skin eruption due to drugs and medicaments taken internally: Secondary | ICD-10-CM | POA: Diagnosis not present

## 2023-11-26 DIAGNOSIS — L853 Xerosis cutis: Secondary | ICD-10-CM

## 2023-11-26 DIAGNOSIS — L03115 Cellulitis of right lower limb: Secondary | ICD-10-CM | POA: Diagnosis not present

## 2023-11-26 NOTE — Telephone Encounter (Signed)
 Patient had labs done at PCP office yesterday. Will have them fax over lab results and if they did not draw the labs needed, patient will call to schedule a lab appointment to have labs drawn.   Provided patient with fax and phone number to clinic.
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
## 2023-11-27 DIAGNOSIS — L270 Generalized skin eruption due to drugs and medicaments taken internally: Secondary | ICD-10-CM | POA: Insufficient documentation

## 2023-11-27 DIAGNOSIS — L853 Xerosis cutis: Secondary | ICD-10-CM | POA: Insufficient documentation

## 2024-01-07 ENCOUNTER — Telehealth: Payer: Self-pay

## 2024-01-07 NOTE — Telephone Encounter (Signed)
 Received call from patient, she noticed a small blister on her right leg and went to urgent care yesterday. They burst the blister and gave her 10 days of amoxicillin. She denies fever or chills, states there's no redness surrounding the blister. Reports it is weeping. She would like to know if she needs to do anything additional.   Amalia Edgecombe, BSN, RN

## 2024-01-08 NOTE — Telephone Encounter (Signed)
 She can continue the amoxicillin for now and let us  know if any worsening symptoms like redness, warmth, more swelling, fevers etc

## 2024-01-27 ENCOUNTER — Encounter (HOSPITAL_COMMUNITY): Payer: Self-pay | Admitting: Emergency Medicine

## 2024-01-27 ENCOUNTER — Emergency Department (HOSPITAL_COMMUNITY)
Admission: EM | Admit: 2024-01-27 | Discharge: 2024-01-27 | Disposition: A | Discharge status: Home / Self Care | Attending: Emergency Medicine | Admitting: Emergency Medicine

## 2024-01-27 ENCOUNTER — Encounter: Payer: Self-pay | Admitting: Orthopedic Surgery

## 2024-01-27 ENCOUNTER — Ambulatory Visit (INDEPENDENT_AMBULATORY_CARE_PROVIDER_SITE_OTHER): Admitting: Orthopedic Surgery

## 2024-01-27 ENCOUNTER — Other Ambulatory Visit: Payer: Self-pay

## 2024-01-27 DIAGNOSIS — T7840XA Allergy, unspecified, initial encounter: Secondary | ICD-10-CM | POA: Diagnosis not present

## 2024-01-27 DIAGNOSIS — E119 Type 2 diabetes mellitus without complications: Secondary | ICD-10-CM | POA: Insufficient documentation

## 2024-01-27 DIAGNOSIS — M25571 Pain in right ankle and joints of right foot: Secondary | ICD-10-CM | POA: Diagnosis not present

## 2024-01-27 DIAGNOSIS — R0602 Shortness of breath: Secondary | ICD-10-CM | POA: Diagnosis present

## 2024-01-27 DIAGNOSIS — Z7901 Long term (current) use of anticoagulants: Secondary | ICD-10-CM | POA: Diagnosis not present

## 2024-01-27 LAB — CBC WITH DIFFERENTIAL/PLATELET
Abs Immature Granulocytes: 0.14 K/uL — ABNORMAL HIGH (ref 0.00–0.07)
Basophils Absolute: 0.1 K/uL (ref 0.0–0.1)
Basophils Relative: 1 %
Eosinophils Absolute: 0 K/uL (ref 0.0–0.5)
Eosinophils Relative: 0 %
HCT: 43.7 % (ref 36.0–46.0)
Hemoglobin: 13.4 g/dL (ref 12.0–15.0)
Immature Granulocytes: 1 %
Lymphocytes Relative: 5 %
Lymphs Abs: 0.8 K/uL (ref 0.7–4.0)
MCH: 26.3 pg (ref 26.0–34.0)
MCHC: 30.7 g/dL (ref 30.0–36.0)
MCV: 85.9 fL (ref 80.0–100.0)
Monocytes Absolute: 0.6 K/uL (ref 0.1–1.0)
Monocytes Relative: 4 %
Neutro Abs: 14.7 K/uL — ABNORMAL HIGH (ref 1.7–7.7)
Neutrophils Relative %: 89 %
Platelets: 308 K/uL (ref 150–400)
RBC: 5.09 MIL/uL (ref 3.87–5.11)
RDW: 13.7 % (ref 11.5–15.5)
WBC: 16.3 K/uL — ABNORMAL HIGH (ref 4.0–10.5)
nRBC: 0 % (ref 0.0–0.2)

## 2024-01-27 LAB — COMPREHENSIVE METABOLIC PANEL WITH GFR
ALT: 27 U/L (ref 0–44)
AST: 28 U/L (ref 15–41)
Albumin: 4.1 g/dL (ref 3.5–5.0)
Alkaline Phosphatase: 110 U/L (ref 38–126)
Anion gap: 14 (ref 5–15)
BUN: 13 mg/dL (ref 8–23)
CO2: 20 mmol/L — ABNORMAL LOW (ref 22–32)
Calcium: 9.1 mg/dL (ref 8.9–10.3)
Chloride: 106 mmol/L (ref 98–111)
Creatinine, Ser: 0.93 mg/dL (ref 0.44–1.00)
GFR, Estimated: >60 mL/min (ref >60)
Glucose, Bld: 232 mg/dL — ABNORMAL HIGH (ref 70–99)
Potassium: 4 mmol/L (ref 3.5–5.1)
Sodium: 140 mmol/L (ref 135–145)
Total Bilirubin: 0.5 mg/dL (ref 0.0–1.2)
Total Protein: 6.7 g/dL (ref 6.5–8.1)

## 2024-01-27 LAB — I-STAT CG4 LACTIC ACID, ED: Lactic Acid, Venous: 2 mmol/L (ref 0.5–1.9)

## 2024-01-27 MED ORDER — LIDOCAINE HCL 1 % IJ SOLN
2.0000 mL | INTRAMUSCULAR | Status: AC | PRN
  Administered 2024-01-27: 2 mL

## 2024-01-27 MED ORDER — METHYLPREDNISOLONE ACETATE 40 MG/ML IJ SUSP
40.0000 mg | INTRAMUSCULAR | Status: AC | PRN
  Administered 2024-01-27: 40 mg via INTRA_ARTICULAR
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
## 2024-01-27 NOTE — Discharge Instructions (Addendum)
 You may have had a reaction to the medication that you were given earlier today.  Follow-up with your physician for recheck.  Discussed possible reaction with your provider for future injections.  Monitor your temperature for any further symptoms.  Return to the emergency department if symptoms worsen or change.

## 2024-01-27 NOTE — Progress Notes (Signed)
 Office Visit Note   Patient: Claudia Holmes           Date of Birth: 1956-01-23           MRN: [id number] Visit Date: 01/27/2024              Requested by: Yolande Toribio MATSU, MD [address] [place],  [place] [address] PCP: Yolande Toribio MATSU, MD  Chief Complaint  Patient presents with   Right Ankle - Pain      HPI: Discussed the use of AI scribe software for clinical note transcription with the patient, who gave verbal consent to proceed.  History of Present Illness Claudia Holmes is a 68 year old female who presents with right ankle pain for follow-up injection therapy.  She has been experiencing right ankle pain, described as 'a little bit of hurting' and tenderness to touch. She previously received injection therapy for this issue.  She was recently hospitalized for cellulitis in the same leg, extending from the ankle to the knee, resulting in a ten-day hospital stay due to sepsis. The origin of the infection was unclear, and she denied any scratches at the time. She is currently taking a probiotic, Align, which contains lactobacillus, to maintain gut health.  She experiences occasional pain from bunions on her toes, although it is not constant or unbearable. She has not pursued surgical intervention for this issue.       Assessment & Plan: Visit Diagnoses:  1. Pain in joint of right ankle     Plan: Assessment and Plan Assessment & Plan Chronic right ankle pain Chronic right ankle pain with tenderness. Previous injections provided relief. - Administer right ankle injection from anterior medial portal. - Follow up as needed.  Dizziness following right ankle injection Dizziness post-injection, a known reaction. No complications. - Advise her to sit back and relax. - Provide a drink of water.  Bunion Bunion causing occasional pain. Surgical intervention not recommended due to risks. - Continue conservative management. - Consider surgery only if she accepts  associated risks.      Follow-Up Instructions: Return if symptoms worsen or fail to improve.   Ortho Exam  Patient is alert, oriented, no adenopathy, well-dressed, normal affect, normal respiratory effort. Physical Exam EXTREMITIES: Right ankle swelling present, no cellulitis or drainage. No obvious infection in leg. Tenderness in right foot.  No open ulcers.      Imaging: No results found. No images are attached to the encounter.  Labs: Lab Results  Component Value Date   HGBA1C 5.9 (H) 11/09/2023   HGBA1C (H) 04/10/2010    5.7 (NOTE)                                                                       According to the ADA Clinical Practice Recommendations for 2011, when HbA1c is used as a screening test:   >=6.5%   Diagnostic of Diabetes Mellitus           (if abnormal result  is confirmed)  5.7-6.4%   Increased risk of developing Diabetes Mellitus  References:Diagnosis and Classification of Diabetes Mellitus,Diabetes Care,2011,34(Suppl 1):S62-S69 and Standards of Medical Care in         Diabetes - 2011,Diabetes [phone]  (  Suppl 1):S11-S61. CORRECTED ON 12/05 AT 2304: PREVIOUSLY REPORTED AS 5.7 Reference range: <5.7   REPTSTATUS 11/15/2023 FINAL 11/09/2023   GRAMSTAIN Rare 11/20/2016   GRAMSTAIN WBC present-predominately Mononuclear 11/20/2016   GRAMSTAIN No Organisms Seen 11/20/2016   CULT  11/09/2023    NO GROWTH 5 DAYS Performed at [facility], [address] [place], [place] [address]    LABORGA NO GROWTH 11/20/2016     Lab Results  Component Value Date   ALBUMIN 2.4 (L) 11/14/2023   ALBUMIN 2.2 (L) 11/13/2023   ALBUMIN 2.6 (L) 11/11/2023   PREALBUMIN 6 (L) 11/10/2023   PREALBUMIN 7.9 (L) 04/18/2010    Lab Results  Component Value Date   MG 2.0 11/17/2023   MG 2.0 11/16/2023   MG 1.9 11/15/2023   No results found for: VD25OH  Lab Results  Component Value Date   PREALBUMIN 6 (L) 11/10/2023   PREALBUMIN 7.9 (L) 04/18/2010      Latest  Ref Rng & Units 11/17/2023    4:01 AM 11/16/2023   10:37 AM 11/15/2023    5:24 AM  CBC EXTENDED  WBC 4.0 - 10.5 K/uL 19.6  15.2  16.0   RBC 3.87 - 5.11 MIL/uL 4.55  4.43  4.40   Hemoglobin 12.0 - 15.0 g/dL 87.1  87.4  87.5   HCT 36.0 - 46.0 % 40.7  39.3  38.3   Platelets 150 - 400 K/uL 510  444  444   NEUT# 1.7 - 7.7 K/uL 14.1     Lymph# 0.7 - 4.0 K/uL 2.5        There is no height or weight on file to calculate BMI.  Orders:  Orders Placed This Encounter  Procedures   Medium Joint Inj: R ankle   No orders of the defined types were placed in this encounter.    Procedures: Medium Joint Inj: R ankle on 01/27/2024 4:41 PM Indications: pain and diagnostic evaluation Details: 22 G 1.5 in needle, anteromedial approach Medications: 2 mL lidocaine  1 %; 40 mg methylPREDNISolone  acetate 40 MG/ML Outcome: tolerated well, no immediate complications Procedure, treatment alternatives, risks and benefits explained, specific risks discussed. Consent was given by the patient. Immediately prior to procedure a time out was called to verify the correct patient, procedure, equipment, support staff and site/side marked as required. Patient was prepped and draped in the usual sterile fashion.      Clinical Data: No additional findings.  ROS:  All other systems negative, except as noted in the HPI. Review of Systems  Objective: Vital Signs: There were no vitals taken for this visit.  Specialty Comments:  No specialty comments available.  PMFS History: Patient Active Problem List   Diagnosis Date Noted   Drug rash 11/27/2023   Dry skin 11/27/2023   Medication management 11/26/2023   Morbid obesity (HCC) 11/26/2023   Hypophosphatemia 11/10/2023   Rhabdomyolysis 11/10/2023   Cellulitis 11/09/2023   AKI (acute kidney injury) 11/09/2023   Dehydration 11/09/2023   Hypokalemia 11/09/2023   DM2 (diabetes mellitus, type 2) (HCC) 11/09/2023   DOE (dyspnea on exertion) 12/05/2020   History  of pulmonary embolism 12/05/2020   Bilateral primary osteoarthritis of knee 08/13/2019   Pulmonary emboli (HCC) 12/09/2017   Mixed hyperlipidemia 12/09/2017   Pulmonary embolism (HCC) 12/09/2017   Past Medical History:  Diagnosis Date   Anxiety    High cholesterol     Family History  Problem Relation Age of Onset   Stroke Mother    Heart  attack Father    Stroke Maternal Grandmother    Heart disease Paternal Grandmother     Past Surgical History:  Procedure Laterality Date   arm surgery     LEG SURGERY     Social History   Occupational History   Not on file  Tobacco Use   Smoking status: Never   Smokeless tobacco: Never  Vaping Use   Vaping status: Never Used  Substance and Sexual Activity   Alcohol use: Never   Drug use: Never   Sexual activity: Not on file

## 2024-01-27 NOTE — ED Notes (Signed)
Unable to obtain labs at this time.
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
## 2024-01-27 NOTE — ED Notes (Signed)
 Gave pt a soda and sandwich.

## 2024-03-09 ENCOUNTER — Encounter: Payer: Self-pay | Admitting: Radiology
